# Patient Record
Sex: Female | Born: 1965 | Race: White | Hispanic: No | Marital: Married | Smoking: Never smoker
Health system: Southern US, Community
[De-identification: ages and names within clinical notes are randomized; demographics above are authoritative.]

## PROBLEM LIST (undated history)

## (undated) DIAGNOSIS — C439 Malignant melanoma of skin, unspecified: Secondary | ICD-10-CM

## (undated) DIAGNOSIS — E669 Obesity, unspecified: Secondary | ICD-10-CM

## (undated) DIAGNOSIS — E559 Vitamin D deficiency, unspecified: Secondary | ICD-10-CM

## (undated) DIAGNOSIS — N951 Menopausal and female climacteric states: Secondary | ICD-10-CM

## (undated) DIAGNOSIS — I10 Essential (primary) hypertension: Secondary | ICD-10-CM

## (undated) DIAGNOSIS — H6981 Other specified disorders of Eustachian tube, right ear: Secondary | ICD-10-CM

## (undated) HISTORY — DX: Vitamin D deficiency, unspecified: E55.9

## (undated) HISTORY — DX: Obesity, unspecified: E66.9

## (undated) HISTORY — DX: Menopausal and female climacteric states: N95.1

## (undated) HISTORY — DX: Other specified disorders of eustachian tube, right ear: H69.81

## (undated) HISTORY — PX: ADENOIDECTOMY: SUR15

## (undated) HISTORY — PX: MELANOMA EXCISION: SHX5266

## (undated) HISTORY — DX: Essential (primary) hypertension: I10

## (undated) HISTORY — PX: CLOSED REDUCTION FINGER WITH PERCUTANEOUS PINNING: SHX5612

---

## 1990-03-06 DIAGNOSIS — C439 Malignant melanoma of skin, unspecified: Secondary | ICD-10-CM

## 1990-03-06 HISTORY — DX: Malignant melanoma of skin, unspecified: C43.9

## 1990-03-06 HISTORY — PX: COLPOSCOPY: SHX161

## 1998-04-05 ENCOUNTER — Other Ambulatory Visit: Admission: RE | Admit: 1998-04-05 | Discharge: 1998-04-05 | Payer: Self-pay | Admitting: *Deleted

## 1999-06-22 ENCOUNTER — Other Ambulatory Visit: Admission: RE | Admit: 1999-06-22 | Discharge: 1999-06-22 | Payer: Self-pay | Admitting: *Deleted

## 2000-06-07 ENCOUNTER — Other Ambulatory Visit: Admission: RE | Admit: 2000-06-07 | Discharge: 2000-06-07 | Payer: Self-pay | Admitting: *Deleted

## 2001-08-28 ENCOUNTER — Encounter: Payer: Self-pay | Admitting: *Deleted

## 2001-08-28 ENCOUNTER — Ambulatory Visit (HOSPITAL_COMMUNITY): Admission: RE | Admit: 2001-08-28 | Discharge: 2001-08-28 | Payer: Self-pay | Admitting: *Deleted

## 2003-03-26 ENCOUNTER — Encounter (INDEPENDENT_AMBULATORY_CARE_PROVIDER_SITE_OTHER): Payer: Self-pay | Admitting: *Deleted

## 2003-03-26 ENCOUNTER — Ambulatory Visit (HOSPITAL_BASED_OUTPATIENT_CLINIC_OR_DEPARTMENT_OTHER): Admission: RE | Admit: 2003-03-26 | Discharge: 2003-03-26 | Payer: Self-pay | Admitting: Plastic Surgery

## 2003-03-26 ENCOUNTER — Ambulatory Visit (HOSPITAL_COMMUNITY): Admission: RE | Admit: 2003-03-26 | Discharge: 2003-03-26 | Payer: Self-pay | Admitting: Plastic Surgery

## 2006-07-02 ENCOUNTER — Ambulatory Visit (HOSPITAL_COMMUNITY): Admission: RE | Admit: 2006-07-02 | Discharge: 2006-07-02 | Payer: Self-pay | Admitting: Specialist

## 2007-08-19 ENCOUNTER — Ambulatory Visit (HOSPITAL_COMMUNITY): Admission: RE | Admit: 2007-08-19 | Discharge: 2007-08-19 | Payer: Self-pay | Admitting: Specialist

## 2008-08-20 ENCOUNTER — Ambulatory Visit (HOSPITAL_COMMUNITY): Admission: RE | Admit: 2008-08-20 | Discharge: 2008-08-20 | Payer: Self-pay | Admitting: Specialist

## 2009-09-21 ENCOUNTER — Ambulatory Visit (HOSPITAL_COMMUNITY): Admission: RE | Admit: 2009-09-21 | Discharge: 2009-09-21 | Payer: Self-pay | Admitting: Specialist

## 2010-07-22 NOTE — Op Note (Signed)
Martha Adkins, [place]                          ACCOUNT NO.:  [id number]   MEDICAL RECORD NO.:  [id number]                   PATIENT TYPE:  AMB   LOCATION:  DSC                                  FACILITY:  [facility]   PHYSICIAN:  Alfredia Ferguson, M.D.               DATE OF BIRTH:  07/02/1965   DATE OF PROCEDURE:  03/26/2003  DATE OF DISCHARGE:                                 OPERATIVE REPORT   PREOPERATIVE DIAGNOSIS:  A 1 cm recurrent dysplastic nevus, left upper back.   POSTOPERATIVE DIAGNOSIS:  A 1 cm recurrent dysplastic nevus, left upper  back.   OPERATION PERFORMED:  An elliptical excision of recurrent dysplastic nevus,  left upper back.   SURGEON:  Alfredia Ferguson, M.D.   ANESTHESIA:  2% Xylocaine 1:100,000 epinephrine.   INDICATIONS FOR PROCEDURE:  The patient is a 45 year old woman who had a  dysplastic removed from her left upper back.  She now has a mature scar but  there is pigmentation at the border of the scar.  She wishes to have the  area further excised.  She understands the risks of trading what she has for  a permanent potentially unsightly scar.  In spite of that she wished to  proceed with the surgery.   DESCRIPTION OF PROCEDURE:  The skin was marked in elliptical fashion with 2  to 3 mm margins around the lesion.  Local anesthesia using 2% Xylocaine  1:100,000 epinephrine was infiltrated.  The back was prepped with Betadine  and draped with sterile drapes.  Elliptical excision of the lesion down to  the level of the subcutaneous tissue was carried out.  Specimen was passed  off for pathology.  Hemostasis was achieved using electrocautery.  The wound  was closed by approximating the dermis using multiple interrupted 3-0  Monocryl sutures followed by running 3-0 Prolene subcuticular.  Dressing was  applied.  The patient was discharged to home in satisfactory condition.                                               Alfredia Ferguson, M.D.    WBB/MEDQ  D:   03/26/2003  T:  03/26/2003  Job:  [id number]   cc:   Theodora Blow. Danella Deis, M.D.  [address]  [place]  [place] [address]  Fax: [phone]

## 2010-10-11 ENCOUNTER — Other Ambulatory Visit (HOSPITAL_COMMUNITY): Payer: Self-pay | Admitting: *Deleted

## 2010-10-11 DIAGNOSIS — Z1231 Encounter for screening mammogram for malignant neoplasm of breast: Secondary | ICD-10-CM

## 2010-11-01 ENCOUNTER — Ambulatory Visit (HOSPITAL_COMMUNITY): Payer: BC Managed Care – PPO

## 2010-11-22 ENCOUNTER — Ambulatory Visit (HOSPITAL_COMMUNITY): Payer: BC Managed Care – PPO

## 2010-12-01 ENCOUNTER — Ambulatory Visit (HOSPITAL_COMMUNITY)
Admission: RE | Admit: 2010-12-01 | Discharge: 2010-12-01 | Disposition: A | Discharge status: Home / Self Care | Payer: BC Managed Care – PPO | Source: Ambulatory Visit | Attending: Specialist | Admitting: Specialist

## 2010-12-01 DIAGNOSIS — Z1231 Encounter for screening mammogram for malignant neoplasm of breast: Secondary | ICD-10-CM | POA: Insufficient documentation

## 2011-11-22 ENCOUNTER — Other Ambulatory Visit (HOSPITAL_COMMUNITY): Payer: Self-pay | Admitting: Specialist

## 2011-11-22 DIAGNOSIS — Z1231 Encounter for screening mammogram for malignant neoplasm of breast: Secondary | ICD-10-CM

## 2011-12-11 ENCOUNTER — Ambulatory Visit (HOSPITAL_COMMUNITY)
Admission: RE | Admit: 2011-12-11 | Discharge: 2011-12-11 | Disposition: A | Discharge status: Home / Self Care | Payer: BC Managed Care – PPO | Source: Ambulatory Visit | Attending: Specialist | Admitting: Specialist

## 2011-12-11 DIAGNOSIS — Z1231 Encounter for screening mammogram for malignant neoplasm of breast: Secondary | ICD-10-CM | POA: Insufficient documentation

## 2011-12-13 ENCOUNTER — Other Ambulatory Visit: Payer: Self-pay | Admitting: Specialist

## 2011-12-13 DIAGNOSIS — R928 Other abnormal and inconclusive findings on diagnostic imaging of breast: Secondary | ICD-10-CM

## 2011-12-20 ENCOUNTER — Ambulatory Visit
Admission: RE | Admit: 2011-12-20 | Discharge: 2011-12-20 | Disposition: A | Discharge status: Home / Self Care | Payer: BC Managed Care – PPO | Source: Ambulatory Visit | Attending: Specialist | Admitting: Specialist

## 2011-12-20 DIAGNOSIS — R928 Other abnormal and inconclusive findings on diagnostic imaging of breast: Secondary | ICD-10-CM

## 2012-12-03 ENCOUNTER — Other Ambulatory Visit (HOSPITAL_COMMUNITY): Payer: Self-pay | Admitting: Specialist

## 2012-12-03 DIAGNOSIS — Z1231 Encounter for screening mammogram for malignant neoplasm of breast: Secondary | ICD-10-CM

## 2012-12-23 ENCOUNTER — Ambulatory Visit (HOSPITAL_COMMUNITY)
Admission: RE | Admit: 2012-12-23 | Discharge: 2012-12-23 | Disposition: A | Discharge status: Home / Self Care | Payer: BC Managed Care – PPO | Source: Ambulatory Visit | Attending: Specialist | Admitting: Specialist

## 2012-12-23 DIAGNOSIS — Z1231 Encounter for screening mammogram for malignant neoplasm of breast: Secondary | ICD-10-CM

## 2013-12-22 ENCOUNTER — Other Ambulatory Visit (HOSPITAL_BASED_OUTPATIENT_CLINIC_OR_DEPARTMENT_OTHER): Payer: Self-pay | Admitting: Specialist

## 2013-12-22 DIAGNOSIS — Z1231 Encounter for screening mammogram for malignant neoplasm of breast: Secondary | ICD-10-CM

## 2014-01-09 ENCOUNTER — Ambulatory Visit (HOSPITAL_BASED_OUTPATIENT_CLINIC_OR_DEPARTMENT_OTHER)
Admission: RE | Admit: 2014-01-09 | Discharge: 2014-01-09 | Disposition: A | Discharge status: Home / Self Care | Payer: BC Managed Care – PPO | Source: Ambulatory Visit | Attending: Specialist | Admitting: Specialist

## 2014-01-09 DIAGNOSIS — Z1231 Encounter for screening mammogram for malignant neoplasm of breast: Secondary | ICD-10-CM | POA: Insufficient documentation

## 2015-10-26 ENCOUNTER — Encounter: Payer: Self-pay | Admitting: *Deleted

## 2015-10-26 ENCOUNTER — Emergency Department (INDEPENDENT_AMBULATORY_CARE_PROVIDER_SITE_OTHER): Payer: BC Managed Care – PPO

## 2015-10-26 ENCOUNTER — Emergency Department
Admission: EM | Admit: 2015-10-26 | Discharge: 2015-10-26 | Disposition: A | Discharge status: Home / Self Care | Payer: BC Managed Care – PPO | Source: Home / Self Care | Attending: Family Medicine | Admitting: Family Medicine

## 2015-10-26 DIAGNOSIS — M25572 Pain in left ankle and joints of left foot: Secondary | ICD-10-CM

## 2015-10-26 DIAGNOSIS — S92355A Nondisplaced fracture of fifth metatarsal bone, left foot, initial encounter for closed fracture: Secondary | ICD-10-CM

## 2015-10-26 DIAGNOSIS — X501XXA Overexertion from prolonged static or awkward postures, initial encounter: Secondary | ICD-10-CM

## 2015-10-26 DIAGNOSIS — M7732 Calcaneal spur, left foot: Secondary | ICD-10-CM

## 2015-10-26 HISTORY — DX: Malignant melanoma of skin, unspecified: C43.9

## 2015-10-26 NOTE — ED Triage Notes (Signed)
Pt reports rolling her left foot/ankle yesterday. She reports same injury with 5th metatarsal fx 20 years ago. Used Ice and IBF @ home.

## 2015-10-26 NOTE — ED Provider Notes (Signed)
CSN: [id number]     Arrival date & time 10/26/15  1533 History   First MD Initiated Contact with Patient 10/26/15 [phone]     Chief Complaint  Patient presents with  . Foot Pain   (Consider location/radiation/quality/duration/timing/severity/associated sxs/prior Treatment) HPI Martha Adkins is a 50 y.o. female presenting to UC with c/o Left lateral foot and ankle pain that started yesterday after she accidentally rolled her foot.  Pain is aching and sore, 6/10.  She has been taking ibuprofen and icing with moderate relief, however pain is more severe with palpation and ambulation.  Reports hx of fracture of her Left 5th metatarsal about 20 years ago. She had a post-op shoe at that time but did not need surgery. No other injuries.    Past Medical History:  Diagnosis Date  . Melanoma [facility])    Past Surgical History:  Procedure Laterality Date  . CLOSED REDUCTION FINGER WITH PERCUTANEOUS PINNING     History reviewed. No pertinent family history. Social History  Substance Use Topics  . Smoking status: Never Smoker  . Smokeless tobacco: Never Used  . Alcohol use Yes   OB History    No data available     Review of Systems  Musculoskeletal: Positive for arthralgias, joint swelling and myalgias.       Left ankle and foot  Skin: Negative for color change and wound.  Neurological: Negative for weakness and numbness.    Allergies  Cortizone-10 [hydrocortisone] and Supartz [sodium hyaluronate]  Home Medications   Prior to Admission medications   Not on File   Meds Ordered and Administered this Visit  Medications - No data to display  BP 147/88 (BP Location: Left Arm)   Pulse 93   Ht 5\' 4"  (1.626 m)   Wt (!) 316 lb (143.3 kg)   LMP 09/30/2015   SpO2 94%   BMI 54.24 kg/m  No data found.   Physical Exam  Constitutional: She is oriented to person, place, and time. She appears well-developed and well-nourished.  HENT:  Head: Normocephalic and atraumatic.  Eyes: EOM are  normal.  Neck: Normal range of motion.  Cardiovascular: Normal rate.   Pulmonary/Chest: Effort normal.  Musculoskeletal: Normal range of motion. She exhibits edema and tenderness.  Left ankle: mild to moderate edema to lateral aspect with mild tenderness. Full ROM. Left foot: mild edema to lateral aspect, tenderness from proximal to mid 5th metatarsal.   Neurological: She is alert and oriented to person, place, and time.  Skin: Skin is warm and dry. Capillary refill takes less than 2 seconds.  Left ankle and foot: skin in tact, no ecchymosis or erythema.   Psychiatric: She has a normal mood and affect. Her behavior is normal.  Nursing note and vitals reviewed.   Urgent Care Course   Clinical Course    Procedures (including critical care time)  Labs Review Labs Reviewed - No data to display  Imaging Review Dg Ankle Complete Left  Result Date: 10/26/2015 CLINICAL DATA:  Twisting injury to the left foot and ankle yesterday while walking. Lateral left foot and ankle pain and swelling. Initial encounter. EXAM: LEFT ANKLE COMPLETE - 3+ VIEW COMPARISON:  No prior left ankle imaging. Left foot x-rays obtained concurrently. FINDINGS: Mild lateral soft tissue swelling. No evidence of acute fracture. Ankle mortise intact with well-preserved joint space. Well-preserved bone mineral density. No intrinsic osseous abnormalities. No visible joint effusion. IMPRESSION: No osseous abnormality involving ankle. Electronically Signed   By: Sherran Needs.D.  On: 10/26/2015 16:53   Dg Foot Complete Left  Result Date: 10/26/2015 CLINICAL DATA:  Twisting injury to the left foot and ankle yesterday while walking. Lateral left foot and ankle pain and swelling. Initial encounter. EXAM: LEFT FOOT - COMPLETE 3+ VIEW COMPARISON:  No prior foot imaging. Left ankle x-rays obtained concurrently. FINDINGS: Nondisplaced fracture involving the proximal shaft of the 5th metatarsal. No fractures elsewhere. Mild  degenerative changes involving the midfoot. Moderate-sized plantar calcaneal spur. IMPRESSION: Acute traumatic nondisplaced fracture involving the proximal shaft of the 5th metatarsal. Electronically Signed   By: Evangeline Dakin M.D.   On: 10/26/2015 16:54     MDM   1. Closed nondisplaced fracture of fifth metatarsal bone of left foot, initial encounter   2. Calcaneal spur of foot, left    Pt c/o Left ankle and foot pain. Tenderness to lateral aspect of ankle and foot on exam.   Plain films: significant for acute traumatic nondisplaced fracture involving the proximal shaft of the 5th metatarsal   Will place pt in [employer].   Pt declined prescription pain medication Home care instructions provided. Encouraged f/u with Sports Medicine or Orthopedist in 2-3 weeks for recheck of symptoms and ongoing management of foot fracture. Patient verbalized understanding and agreement with treatment plan.     Noland Fordyce, PA-C 10/26/15 [phone]

## 2016-03-20 LAB — HM MAMMOGRAPHY

## 2016-03-21 LAB — HM PAP SMEAR: HM Pap smear: NEGATIVE

## 2016-08-30 ENCOUNTER — Ambulatory Visit (INDEPENDENT_AMBULATORY_CARE_PROVIDER_SITE_OTHER): Payer: BC Managed Care – PPO | Admitting: Physician Assistant

## 2016-08-30 ENCOUNTER — Encounter: Payer: Self-pay | Admitting: Physician Assistant

## 2016-08-30 VITALS — BP 150/100 | HR 73 | Temp 98.4°F | Resp 18 | Wt 323.1 lb

## 2016-08-30 DIAGNOSIS — I10 Essential (primary) hypertension: Secondary | ICD-10-CM | POA: Insufficient documentation

## 2016-08-30 DIAGNOSIS — H66001 Acute suppurative otitis media without spontaneous rupture of ear drum, right ear: Secondary | ICD-10-CM

## 2016-08-30 DIAGNOSIS — R030 Elevated blood-pressure reading, without diagnosis of hypertension: Secondary | ICD-10-CM | POA: Diagnosis not present

## 2016-08-30 DIAGNOSIS — N951 Menopausal and female climacteric states: Secondary | ICD-10-CM

## 2016-08-30 DIAGNOSIS — Z7689 Persons encountering health services in other specified circumstances: Secondary | ICD-10-CM | POA: Diagnosis not present

## 2016-08-30 DIAGNOSIS — J069 Acute upper respiratory infection, unspecified: Secondary | ICD-10-CM

## 2016-08-30 HISTORY — DX: Menopausal and female climacteric states: N95.1

## 2016-08-30 MED ORDER — IPRATROPIUM BROMIDE 0.06 % NA SOLN: 1.0000 | Freq: Four times a day (QID) | NASAL | 0 refills | Status: DC | PRN

## 2016-08-30 MED ORDER — AMOXICILLIN-POT CLAVULANATE 875-125 MG PO TABS: 1.0000 | ORAL_TABLET | Freq: Two times a day (BID) | ORAL | 0 refills | Status: AC

## 2016-08-30 MED ORDER — BENZONATATE 200 MG PO CAPS: 200.0000 mg | ORAL_CAPSULE | Freq: Three times a day (TID) | ORAL | 0 refills | Status: DC | PRN

## 2016-08-30 NOTE — Progress Notes (Signed)
HPI:                                                                Martha Adkins is a 51 y.o. female who presents to [facility]: Primary Care [facility] today to establish care  Current concerns include cough / URI symptoms  Cough  This is a new problem. The current episode started in the past 7 days (x 4 days). The problem has been unchanged. The problem occurs hourly. The cough is productive of sputum. Associated symptoms include ear congestion (right), myalgias, nasal congestion, rhinorrhea, a sore throat, shortness of breath and wheezing. Pertinent negatives include no chest pain, chills, fever, headaches or rash. The symptoms are aggravated by lying down. Treatments tried: Ibuprofen. There is no history of asthma, COPD or environmental allergies.    Past Medical History:  Diagnosis Date  . Melanoma [facility])    Past Surgical History:  Procedure Laterality Date  . CLOSED REDUCTION FINGER WITH PERCUTANEOUS PINNING     Social History  Substance Use Topics  . Smoking status: Never Smoker  . Smokeless tobacco: Never Used  . Alcohol use Yes   family history is not on file.  ROS: Review of Systems  Constitutional: Negative for chills and fever.  HENT: Positive for rhinorrhea and sore throat.   Respiratory: Positive for cough, shortness of breath and wheezing.   Cardiovascular: Negative for chest pain, palpitations, claudication and leg swelling.  Genitourinary: Negative for hematuria.  Musculoskeletal: Positive for myalgias.  Skin: Negative for rash.  Neurological: Negative for headaches.  Endo/Heme/Allergies: Negative for environmental allergies.     Medications: No current outpatient prescriptions on file.   No current facility-administered medications for this visit.    Allergies  Allergen Reactions  . Cortizone-10 [Hydrocortisone]   . Supartz [Sodium Hyaluronate]        Objective:  BP (!) 177/108   Pulse 73   Temp 98.4 F (36.9 C)    Resp 18   Wt (!) 323 lb 1.6 oz (146.6 kg)   BMI 55.46 kg/m  Gen: well-groomed, cooperative, not ill-appearing, no distress HEENT: normal conjunctiva, left external canal normal and left TM clear, right external canal normal, right TM erythematous with suppurative effusion, no mastoid tenderness, nasal mucosa edemetous, oropharynx clear, moist mucus membranes, no frontal or maxillary sinus tenderness, neck supple, trachea midline Pulm: Normal work of breathing, normal phonation, clear to auscultation bilaterally, no wheezes, rales or rhonchi CV: Normal rate, regular rhythm, s1 and s2 distinct, no murmurs, clicks or rubs  GI: abdomen soft, nondistended, nontender Neuro: alert and oriented x 3, EOM's intact, no tremor MSK: moving all extremities, normal gait and station, no peripheral edema Lymph: no cervical or tonsillar adenopathy Skin: warm, dry, intact; no rashes or lesions on exposed skin, no cyanosis   No results found for this or any previous visit (from the past 72 hour(s)). No results found.    Assessment and Plan: 51 y.o. female with   1. Encounter to establish care - reviewed PMH   2. Elevated blood pressure reading - patient asymptomatic with elevated BP today. Denies history of hypertension - Check blood pressure at home for the next 2 weeks - Check around the same time each day in a relaxed setting  -  Limit salt. Follow DASH eating plan - Follow-up in 2 weeks  3. Perimenopausal   4. Acute suppurative otitis media of right ear without spontaneous rupture of tympanic membrane, recurrence not specified - tympanogram shows low peak height and large ear volume - amoxicillin-clavulanate (AUGMENTIN) 875-125 MG tablet; Take 1 tablet by mouth 2 (two) times daily.  Dispense: 20 tablet; Refill: 0 - ipratropium (ATROVENT) 0.06 % nasal spray; Place 1 spray into both nostrils 4 (four) times daily as needed.  Dispense: 15 mL; Refill: 0  5. Acute URI - ipratropium (ATROVENT)  0.06 % nasal spray; Place 1 spray into both nostrils 4 (four) times daily as needed.  Dispense: 15 mL; Refill: 0 - benzonatate (TESSALON) 200 MG capsule; Take 1 capsule (200 mg total) by mouth 3 (three) times daily as needed for cough.  Dispense: 45 capsule; Refill: 0     Patient education and anticipatory guidance given Patient agrees with treatment plan Follow-up in 2 weeks for blood pressure or sooner as needed if symptoms worsen or fail to improve  Darlyne Russian PA-C

## 2016-08-30 NOTE — Patient Instructions (Addendum)
For your ear/upper respiratory infection - Take antibiotic as prescribed - Use nasal spray up to 4 times daily - Continue Ibuprofen 600mg  every 8 hours as needed - May also add Tylenol 1000mg  every 8 hours for pain - Mucinex to help make cough more productive. Take with at least 8 ounces of water - Tessalon pearls (benzonatate) every 8 hours as needed for cough - Drink plenty of clear liquids  For your blood pressure: - Check blood pressure at home for the next 2 weeks - Check around the same time each day in a relaxed setting  - Limit salt. Follow DASH eating plan - Follow-up in 2 weeks    DASH Eating Plan DASH stands for "Dietary Approaches to Stop Hypertension." The DASH eating plan is a healthy eating plan that has been shown to reduce high blood pressure (hypertension). It may also reduce your risk for type 2 diabetes, heart disease, and stroke. The DASH eating plan may also help with weight loss. What are tips for following this plan? General guidelines  Avoid eating more than 2,300 mg (milligrams) of salt (sodium) a day. If you have hypertension, you may need to reduce your sodium intake to 1,500 mg a day.  Limit alcohol intake to no more than 1 drink a day for nonpregnant women and 2 drinks a day for men. One drink equals 12 oz of beer, 5 oz of wine, or 1 oz of hard liquor.  Work with your health care provider to maintain a healthy body weight or to lose weight. Ask what an ideal weight is for you.  Get at least 30 minutes of exercise that causes your heart to beat faster (aerobic exercise) most days of the week. Activities may include walking, swimming, or biking.  Work with your health care provider or diet and nutrition specialist (dietitian) to adjust your eating plan to your individual calorie needs. Reading food labels  Check food labels for the amount of sodium per serving. Choose foods with less than 5 percent of the Daily Value of sodium. Generally, foods with less  than 300 mg of sodium per serving fit into this eating plan.  To find whole grains, look for the word "whole" as the first word in the ingredient list. Shopping  Buy products labeled as "low-sodium" or "no salt added."  Buy fresh foods. Avoid canned foods and premade or frozen meals. Cooking  Avoid adding salt when cooking. Use salt-free seasonings or herbs instead of table salt or sea salt. Check with your health care provider or pharmacist before using salt substitutes.  Do not fry foods. Cook foods using healthy methods such as baking, boiling, grilling, and broiling instead.  Cook with heart-healthy oils, such as olive, canola, soybean, or sunflower oil. Meal planning   Eat a balanced diet that includes: ? 5 or more servings of fruits and vegetables each day. At each meal, try to fill half of your plate with fruits and vegetables. ? Up to 6-8 servings of whole grains each day. ? Less than 6 oz of lean meat, poultry, or fish each day. A 3-oz serving of meat is about the same size as a deck of cards. One egg equals 1 oz. ? 2 servings of low-fat dairy each day. ? A serving of nuts, seeds, or beans 5 times each week. ? Heart-healthy fats. Healthy fats called Omega-3 fatty acids are found in foods such as flaxseeds and coldwater fish, like sardines, salmon, and mackerel.  Limit how much you eat  of the following: ? Canned or prepackaged foods. ? Food that is high in trans fat, such as fried foods. ? Food that is high in saturated fat, such as fatty meat. ? Sweets, desserts, sugary drinks, and other foods with added sugar. ? Full-fat dairy products.  Do not salt foods before eating.  Try to eat at least 2 vegetarian meals each week.  Eat more home-cooked food and less restaurant, buffet, and fast food.  When eating at a restaurant, ask that your food be prepared with less salt or no salt, if possible. What foods are recommended? The items listed may not be a complete list. Talk  with your dietitian about what dietary choices are best for you. Grains Whole-grain or whole-wheat bread. Whole-grain or whole-wheat pasta. Brown rice. Modena Morrow. Bulgur. Whole-grain and low-sodium cereals. Pita bread. Low-fat, low-sodium crackers. Whole-wheat flour tortillas. Vegetables Fresh or frozen vegetables (raw, steamed, roasted, or grilled). Low-sodium or reduced-sodium tomato and vegetable juice. Low-sodium or reduced-sodium tomato sauce and tomato paste. Low-sodium or reduced-sodium canned vegetables. Fruits All fresh, dried, or frozen fruit. Canned fruit in natural juice (without added sugar). Meat and other protein foods Skinless chicken or [place]. Ground chicken or [place]. Pork with fat trimmed off. Fish and seafood. Egg whites. Dried beans, peas, or lentils. Unsalted nuts, nut butters, and seeds. Unsalted canned beans. Lean cuts of beef with fat trimmed off. Low-sodium, lean deli meat. Dairy Low-fat (1%) or fat-free (skim) milk. Fat-free, low-fat, or reduced-fat cheeses. Nonfat, low-sodium ricotta or cottage cheese. Low-fat or nonfat yogurt. Low-fat, low-sodium cheese. Fats and oils Soft margarine without trans fats. Vegetable oil. Low-fat, reduced-fat, or light mayonnaise and salad dressings (reduced-sodium). Canola, safflower, olive, soybean, and sunflower oils. Avocado. Seasoning and other foods Herbs. Spices. Seasoning mixes without salt. Unsalted popcorn and pretzels. Fat-free sweets. What foods are not recommended? The items listed may not be a complete list. Talk with your dietitian about what dietary choices are best for you. Grains Baked goods made with fat, such as croissants, muffins, or some breads. Dry pasta or rice meal packs. Vegetables Creamed or fried vegetables. Vegetables in a cheese sauce. Regular canned vegetables (not low-sodium or reduced-sodium). Regular canned tomato sauce and paste (not low-sodium or reduced-sodium). Regular tomato and vegetable  juice (not low-sodium or reduced-sodium). Angie Fava. Olives. Fruits Canned fruit in a light or heavy syrup. Fried fruit. Fruit in cream or butter sauce. Meat and other protein foods Fatty cuts of meat. Ribs. Fried meat. Berniece Salines. Sausage. Bologna and other processed lunch meats. Salami. Fatback. Hotdogs. Bratwurst. Salted nuts and seeds. Canned beans with added salt. Canned or smoked fish. Whole eggs or egg yolks. Chicken or [place] with skin. Dairy Whole or 2% milk, cream, and half-and-half. Whole or full-fat cream cheese. Whole-fat or sweetened yogurt. Full-fat cheese. Nondairy creamers. Whipped toppings. Processed cheese and cheese spreads. Fats and oils Butter. Stick margarine. Lard. Shortening. Ghee. Bacon fat. Tropical oils, such as coconut, palm kernel, or palm oil. Seasoning and other foods Salted popcorn and pretzels. Onion salt, garlic salt, seasoned salt, table salt, and sea salt. Worcestershire sauce. Tartar sauce. Barbecue sauce. Teriyaki sauce. Soy sauce, including reduced-sodium. Steak sauce. Canned and packaged gravies. Fish sauce. Oyster sauce. Cocktail sauce. Horseradish that you find on the shelf. Ketchup. Mustard. Meat flavorings and tenderizers. Bouillon cubes. Hot sauce and Tabasco sauce. Premade or packaged marinades. Premade or packaged taco seasonings. Relishes. Regular salad dressings. Where to find more information:  National Heart, Lung, and Blood Institute: https://wilson-eaton.com/  American Heart  Association: www.heart.org Summary  The DASH eating plan is a healthy eating plan that has been shown to reduce high blood pressure (hypertension). It may also reduce your risk for type 2 diabetes, heart disease, and stroke.  With the DASH eating plan, you should limit salt (sodium) intake to 2,300 mg a day. If you have hypertension, you may need to reduce your sodium intake to 1,500 mg a day.  When on the DASH eating plan, aim to eat more fresh fruits and vegetables, whole grains,  lean proteins, low-fat dairy, and heart-healthy fats.  Work with your health care provider or diet and nutrition specialist (dietitian) to adjust your eating plan to your individual calorie needs. This information is not intended to replace advice given to you by your health care provider. Make sure you discuss any questions you have with your health care provider. Document Released: 02/09/2011 Document Revised: 02/14/2016 Document Reviewed: 02/14/2016 Elsevier Interactive Patient Education  2017 [employer].

## 2016-09-13 ENCOUNTER — Encounter: Payer: Self-pay | Admitting: Physician Assistant

## 2016-09-13 ENCOUNTER — Ambulatory Visit (INDEPENDENT_AMBULATORY_CARE_PROVIDER_SITE_OTHER): Payer: BC Managed Care – PPO | Admitting: Physician Assistant

## 2016-09-13 VITALS — BP 154/85 | HR 69 | Temp 97.7°F | Wt 323.0 lb

## 2016-09-13 DIAGNOSIS — H6991 Unspecified Eustachian tube disorder, right ear: Secondary | ICD-10-CM

## 2016-09-13 DIAGNOSIS — H6981 Other specified disorders of Eustachian tube, right ear: Secondary | ICD-10-CM | POA: Diagnosis not present

## 2016-09-13 DIAGNOSIS — I10 Essential (primary) hypertension: Secondary | ICD-10-CM | POA: Diagnosis not present

## 2016-09-13 DIAGNOSIS — Z9189 Other specified personal risk factors, not elsewhere classified: Secondary | ICD-10-CM | POA: Diagnosis not present

## 2016-09-13 DIAGNOSIS — H6591 Unspecified nonsuppurative otitis media, right ear: Secondary | ICD-10-CM | POA: Diagnosis not present

## 2016-09-13 HISTORY — DX: Essential (primary) hypertension: I10

## 2016-09-13 HISTORY — DX: Unspecified eustachian tube disorder, right ear: H69.91

## 2016-09-13 HISTORY — DX: Other specified disorders of eustachian tube, right ear: H69.81

## 2016-09-13 MED ORDER — CETIRIZINE HCL 10 MG PO TABS: 10.0000 mg | ORAL_TABLET | Freq: Every day | ORAL | 11 refills | Status: DC

## 2016-09-13 MED ORDER — HYDROCHLOROTHIAZIDE 25 MG PO TABS: 25.0000 mg | ORAL_TABLET | Freq: Every day | ORAL | 0 refills | Status: DC

## 2016-09-13 MED ORDER — PREDNISONE 50 MG PO TABS: ORAL_TABLET | ORAL | 0 refills | Status: DC

## 2016-09-13 MED ORDER — TRIAMCINOLONE ACETONIDE 55 MCG/ACT NA AERO: 2.0000 | INHALATION_SPRAY | Freq: Every day | NASAL | 12 refills | Status: DC

## 2016-09-13 NOTE — Progress Notes (Signed)
HPI:                                                                Martha Adkins is a 51 y.o. female who presents to [facility]: [place] today for blood pressure follow-up  Patient has been monitoring BP's at home after elevated reading in the office. BP range 144-166/74-89 Denies vision change, headache, chest pain with exertion, orthopnea, lightheadedness, syncope and edema. Patient does endorse snoring and excessive daytime sleepiness. Risk factors include: morbid obesity  Continues to endorse nasal congestion, PND, right ear congestion with mild hearing loss. She has completed a course of Augmentin and has been using Atrovent nasal spray. States nasal spray worsens her congestion. Reports history of recurrent ear infections in childhood requiring tympanostomy and adenoidectomy.  Past Medical History:  Diagnosis Date  . Eustachian tube dysfunction, right 09/13/2016  . Hypertension goal BP (blood pressure) < 130/80 09/13/2016  . Melanoma ([place]) 1992   back  . Obesity   . Perimenopausal 08/30/2016  . Vitamin D deficiency    Past Surgical History:  Procedure Laterality Date  . ADENOIDECTOMY    . CLOSED REDUCTION FINGER WITH PERCUTANEOUS PINNING    . COLPOSCOPY  1992   laser  . MELANOMA EXCISION     Back   Social History  Substance Use Topics  . Smoking status: Passive Smoke Exposure - Never Smoker  . Smokeless tobacco: Never Used  . Alcohol use Yes     Comment: 2 / month   family history includes COPD in her mother; Hyperlipidemia in her father; Prostate cancer in her father.  ROS: negative except as noted in the HPI  Medications: Current Outpatient Prescriptions  Medication Sig Dispense Refill  . benzonatate (TESSALON) 200 MG capsule Take 1 capsule (200 mg total) by mouth 3 (three) times daily as needed for cough. 45 capsule 0  . Ergocalciferol (VITAMIN D2 PO) Take 5,000 mg by mouth.    . Multiple Vitamins-Minerals (MULTIVITAMIN  ADULT PO) Take by mouth.    . Omega-3 Fatty Acids (FISH OIL) 1000 MG CAPS Take by mouth.    . cetirizine (ZYRTEC) 10 MG tablet Take 1 tablet (10 mg total) by mouth daily. 30 tablet 11  . hydrochlorothiazide (HYDRODIURIL) 25 MG tablet Take 1 tablet (25 mg total) by mouth daily. 30 tablet 0  . predniSONE (DELTASONE) 50 MG tablet One tab PO daily for 5 days. 5 tablet 0  . triamcinolone (NASACORT) 55 MCG/ACT AERO nasal inhaler Place 2 sprays into the nose daily. 1 Inhaler 12   No current facility-administered medications for this visit.    Allergies  Allergen Reactions  . Cortizone-10 [Hydrocortisone]   . Supartz [Sodium Hyaluronate]     Objective:  BP (!) 154/85   Pulse 69   Temp 97.7 F (36.5 C) (Oral)   Wt (!) 323 lb (146.5 kg)   SpO2 99%   BMI 55.44 kg/m  Gen: well-groomed, cooperative, not ill-appearing, no distress HEENT: normal conjunctiva, left TM clear, right TM with serous effusion, nasal mucosa edematous, oropharynx clear, moist mucus membranes, no frontal or maxillary sinus tenderness, neck supple, trachea midline Pulm: Normal work of breathing, normal phonation, clear to auscultation bilaterally, no wheezes, rales or rhonchi CV: Normal rate, regular  rhythm, s1 and s2 distinct, no murmurs, clicks or rubs  GI: abdomen soft, nondistended, nontender Neuro: alert and oriented x 3, EOM's intact, no tremor MSK: moving all extremities, normal gait and station, no peripheral edema Lymph: no cervical or tonsillar adenopathy Skin: warm, dry, intact; no rashes or lesions on exposed skin, no cyanosis   No results found for this or any previous visit (from the past 72 hour(s)). No results found.    Assessment and Plan: 51 y.o. female with   1. Hypertension goal BP (blood pressure) < 130/80 - hydrochlorothiazide (HYDRODIURIL) 25 MG tablet; Take 1 tablet (25 mg total) by mouth daily.  Dispense: 30 tablet; Refill: 0 - therapeutic lifestyle changes - follow-up in 2 weeks for  nurse visit BP check  2. Eustachian tube dysfunction, right - triamcinolone (NASACORT) 55 MCG/ACT AERO nasal inhaler; Place 2 sprays into the nose daily.  Dispense: 1 Inhaler; Refill: 12 - cetirizine (ZYRTEC) 10 MG tablet; Take 1 tablet (10 mg total) by mouth daily.  Dispense: 30 tablet; Refill: 11  3. Middle ear effusion, right - predniSONE (DELTASONE) 50 MG tablet; One tab PO daily for 5 days.  Dispense: 5 tablet; Refill: 0 - If symptoms persist, referral to ENT  4. At risk for obstructive sleep apnea + STOPBANG - patient declines sleep study at this time  Patient education and anticipatory guidance given Patient agrees with treatment plan Follow-up in 2 weeks or sooner as needed if symptoms worsen or fail to improve  Darlyne Russian PA-C

## 2016-09-13 NOTE — Patient Instructions (Addendum)
For your blood pressure: - Start hydrochlorothiazide every morning - Check blood pressure at home and log - Check around the same time each day in a relaxed setting  - Limit salt. Follow DASH eating plan - Follow-up in 2 weeks for a nurse visit BP check  For ear: - Nasacort 1 spray each nostril daily - Cetirizine (Zyrtec) 1 tab at bedtime daily - Prednisone x 5 days - If symptoms persist, referral to ENT   Hypertension Hypertension, commonly called high blood pressure, is when the force of blood pumping through the arteries is too strong. The arteries are the blood vessels that carry blood from the heart throughout the body. Hypertension forces the heart to work harder to pump blood and may cause arteries to become narrow or stiff. Having untreated or uncontrolled hypertension can cause heart attacks, strokes, kidney disease, and other problems. A blood pressure reading consists of a higher number over a lower number. Ideally, your blood pressure should be below 120/80. The first ("top") number is called the systolic pressure. It is a measure of the pressure in your arteries as your heart beats. The second ("bottom") number is called the diastolic pressure. It is a measure of the pressure in your arteries as the heart relaxes. What are the causes? The cause of this condition is not known. What increases the risk? Some risk factors for high blood pressure are under your control. Others are not. Factors you can change  Smoking.  Having type 2 diabetes mellitus, high cholesterol, or both.  Not getting enough exercise or physical activity.  Being overweight.  Having too much fat, sugar, calories, or salt (sodium) in your diet.  Drinking too much alcohol. Factors that are difficult or impossible to change  Having chronic kidney disease.  Having a family history of high blood pressure.  Age. Risk increases with age.  Race. You may be at higher risk if you are  African-American.  Gender. Men are at higher risk than women before age 74. After age 13, women are at higher risk than men.  Having obstructive sleep apnea.  Stress. What are the signs or symptoms? Extremely high blood pressure (hypertensive crisis) may cause:  Headache.  Anxiety.  Shortness of breath.  Nosebleed.  Nausea and vomiting.  Severe chest pain.  Jerky movements you cannot control (seizures).  How is this diagnosed? This condition is diagnosed by measuring your blood pressure while you are seated, with your arm resting on a surface. The cuff of the blood pressure monitor will be placed directly against the skin of your upper arm at the level of your heart. It should be measured at least twice using the same arm. Certain conditions can cause a difference in blood pressure between your right and left arms. Certain factors can cause blood pressure readings to be lower or higher than normal (elevated) for a short period of time:  When your blood pressure is higher when you are in a health care provider's office than when you are at home, this is called white coat hypertension. Most people with this condition do not need medicines.  When your blood pressure is higher at home than when you are in a health care provider's office, this is called masked hypertension. Most people with this condition may need medicines to control blood pressure.  If you have a high blood pressure reading during one visit or you have normal blood pressure with other risk factors:  You may be asked to return on a  different day to have your blood pressure checked again.  You may be asked to monitor your blood pressure at home for 1 week or longer.  If you are diagnosed with hypertension, you may have other blood or imaging tests to help your health care provider understand your overall risk for other conditions. How is this treated? This condition is treated by making healthy lifestyle changes,  such as eating healthy foods, exercising more, and reducing your alcohol intake. Your health care provider may prescribe medicine if lifestyle changes are not enough to get your blood pressure under control, and if:  Your systolic blood pressure is above 130.  Your diastolic blood pressure is above 80.  Your personal target blood pressure may vary depending on your medical conditions, your age, and other factors. Follow these instructions at home: Eating and drinking  Eat a diet that is high in fiber and potassium, and low in sodium, added sugar, and fat. An example eating plan is called the DASH (Dietary Approaches to Stop Hypertension) diet. To eat this way: ? Eat plenty of fresh fruits and vegetables. Try to fill half of your plate at each meal with fruits and vegetables. ? Eat whole grains, such as whole wheat pasta, brown rice, or whole grain bread. Fill about one quarter of your plate with whole grains. ? Eat or drink low-fat dairy products, such as skim milk or low-fat yogurt. ? Avoid fatty cuts of meat, processed or cured meats, and poultry with skin. Fill about one quarter of your plate with lean proteins, such as fish, chicken without skin, beans, eggs, and tofu. ? Avoid premade and processed foods. These tend to be higher in sodium, added sugar, and fat.  Reduce your daily sodium intake. Most people with hypertension should eat less than 1,500 mg of sodium a day.  Limit alcohol intake to no more than 1 drink a day for nonpregnant women and 2 drinks a day for men. One drink equals 12 oz of beer, 5 oz of wine, or 1 oz of hard liquor. Lifestyle  Work with your health care provider to maintain a healthy body weight or to lose weight. Ask what an ideal weight is for you.  Get at least 30 minutes of exercise that causes your heart to beat faster (aerobic exercise) most days of the week. Activities may include walking, swimming, or biking.  Include exercise to strengthen your muscles  (resistance exercise), such as pilates or lifting weights, as part of your weekly exercise routine. Try to do these types of exercises for 30 minutes at least 3 days a week.  Do not use any products that contain nicotine or tobacco, such as cigarettes and e-cigarettes. If you need help quitting, ask your health care provider.  Monitor your blood pressure at home as told by your health care provider.  Keep all follow-up visits as told by your health care provider. This is important. Medicines  Take over-the-counter and prescription medicines only as told by your health care provider. Follow directions carefully. Blood pressure medicines must be taken as prescribed.  Do not skip doses of blood pressure medicine. Doing this puts you at risk for problems and can make the medicine less effective.  Ask your health care provider about side effects or reactions to medicines that you should watch for. Contact a health care provider if:  You think you are having a reaction to a medicine you are taking.  You have headaches that keep coming back (recurring).  You  feel dizzy.  You have swelling in your ankles.  You have trouble with your vision. Get help right away if:  You develop a severe headache or confusion.  You have unusual weakness or numbness.  You feel faint.  You have severe pain in your chest or abdomen.  You vomit repeatedly.  You have trouble breathing. Summary  Hypertension is when the force of blood pumping through your arteries is too strong. If this condition is not controlled, it may put you at risk for serious complications.  Your personal target blood pressure may vary depending on your medical conditions, your age, and other factors. For most people, a normal blood pressure is less than 120/80.  Hypertension is treated with lifestyle changes, medicines, or a combination of both. Lifestyle changes include weight loss, eating a healthy, low-sodium diet, exercising  more, and limiting alcohol. This information is not intended to replace advice given to you by your health care provider. Make sure you discuss any questions you have with your health care provider. Document Released: 02/20/2005 Document Revised: 01/19/2016 Document Reviewed: 01/19/2016 Elsevier Interactive Patient Education  [employer].

## 2016-09-20 ENCOUNTER — Encounter: Payer: Self-pay | Admitting: Physician Assistant

## 2016-09-21 ENCOUNTER — Encounter: Payer: Self-pay | Admitting: Physician Assistant

## 2016-09-29 ENCOUNTER — Telehealth: Payer: Self-pay

## 2016-09-29 ENCOUNTER — Ambulatory Visit (INDEPENDENT_AMBULATORY_CARE_PROVIDER_SITE_OTHER): Payer: BC Managed Care – PPO | Admitting: Physician Assistant

## 2016-09-29 VITALS — BP 154/90 | HR 87 | Resp 16 | Wt 310.9 lb

## 2016-09-29 DIAGNOSIS — I152 Hypertension secondary to endocrine disorders: Secondary | ICD-10-CM | POA: Insufficient documentation

## 2016-09-29 DIAGNOSIS — I10 Essential (primary) hypertension: Secondary | ICD-10-CM

## 2016-09-29 DIAGNOSIS — E1159 Type 2 diabetes mellitus with other circulatory complications: Secondary | ICD-10-CM | POA: Insufficient documentation

## 2016-09-29 MED ORDER — CANDESARTAN CILEXETIL-HCTZ 32-25 MG PO TABS: 1.0000 | ORAL_TABLET | Freq: Every day | ORAL | 1 refills | Status: DC

## 2016-09-29 NOTE — Telephone Encounter (Signed)
Pt.notified

## 2016-09-29 NOTE — Telephone Encounter (Signed)
-----   Message from [facility], [place] sent at 09/29/2016  9:27 AM EDT ----- Discontinue hydrodiuril Start Candesartan-HCTZ daily Continue to log BP's at home Return in 2 weeks for nurse BP check

## 2016-09-29 NOTE — Progress Notes (Signed)
Discontinue hydrodiuril Start Candesartan-HCTZ daily Continue to log BP's at home Return in 2 weeks for nurse BP check

## 2016-10-24 ENCOUNTER — Telehealth: Payer: Self-pay

## 2016-10-24 NOTE — Telephone Encounter (Signed)
Notified patient and transferred to scheduling for nurse appointment.

## 2016-10-24 NOTE — Telephone Encounter (Signed)
Reduce dose to 1/2 tablet daily. Continue to monitor BP's at home to a goal of <130/80 Come in for a nurse visit BP check in 1 week

## 2016-10-24 NOTE — Telephone Encounter (Signed)
Pt called and said that with the Candesartan cilexetil-HCTZ, she feels that her BP is dropping too low.  She had an episode last week of dizziness, clamminess, took it, and it was in the 90s/70s.  She stopped taking the medication Saturday, and it has come up to the 130s/80s.  Please advise.

## 2017-03-08 ENCOUNTER — Ambulatory Visit: Payer: BC Managed Care – PPO | Admitting: Physician Assistant

## 2017-03-08 ENCOUNTER — Encounter: Payer: Self-pay | Admitting: Physician Assistant

## 2017-03-08 VITALS — BP 161/102 | HR 76 | Temp 98.1°F | Resp 16 | Ht 64.0 in | Wt 310.6 lb

## 2017-03-08 DIAGNOSIS — E1169 Type 2 diabetes mellitus with other specified complication: Secondary | ICD-10-CM

## 2017-03-08 DIAGNOSIS — Z136 Encounter for screening for cardiovascular disorders: Secondary | ICD-10-CM

## 2017-03-08 DIAGNOSIS — I10 Essential (primary) hypertension: Secondary | ICD-10-CM

## 2017-03-08 DIAGNOSIS — Z79899 Other long term (current) drug therapy: Secondary | ICD-10-CM

## 2017-03-08 DIAGNOSIS — R05 Cough: Secondary | ICD-10-CM

## 2017-03-08 DIAGNOSIS — Z6841 Body Mass Index (BMI) 40.0 and over, adult: Secondary | ICD-10-CM

## 2017-03-08 DIAGNOSIS — E785 Hyperlipidemia, unspecified: Secondary | ICD-10-CM

## 2017-03-08 DIAGNOSIS — Z131 Encounter for screening for diabetes mellitus: Secondary | ICD-10-CM

## 2017-03-08 DIAGNOSIS — Z130 Encounter for screening for diseases of the blood and blood-forming organs and certain disorders involving the immune mechanism: Secondary | ICD-10-CM

## 2017-03-08 DIAGNOSIS — R058 Other specified cough: Secondary | ICD-10-CM

## 2017-03-08 DIAGNOSIS — Z13220 Encounter for screening for lipoid disorders: Secondary | ICD-10-CM

## 2017-03-08 DIAGNOSIS — Z85820 Personal history of malignant melanoma of skin: Secondary | ICD-10-CM | POA: Insufficient documentation

## 2017-03-08 DIAGNOSIS — E119 Type 2 diabetes mellitus without complications: Secondary | ICD-10-CM

## 2017-03-08 DIAGNOSIS — Z9889 Other specified postprocedural states: Secondary | ICD-10-CM

## 2017-03-08 LAB — HM DIABETES EYE EXAM

## 2017-03-08 MED ORDER — ASPIRIN EC 81 MG PO TBEC: 81.0000 mg | DELAYED_RELEASE_TABLET | Freq: Every day | ORAL | 3 refills | Status: DC

## 2017-03-08 MED ORDER — CANDESARTAN CILEXETIL-HCTZ 32-25 MG PO TABS: 1.0000 | ORAL_TABLET | Freq: Every day | ORAL | 1 refills | Status: DC

## 2017-03-08 NOTE — Patient Instructions (Addendum)
For your blood pressure: - send BP readings to me over MyChart  - Goal <130/80  - Low blood pressure is <90/60. Anything in between is okay. - Continue your BP meds - Start baby aspirin 81 mg to help prevent heart attack/stroke - Check blood pressure at home, log your readings  - Check around the same time each day in a relaxed setting - Limit salt to <1500 mg/day - Follow DASH eating plan - limit alcohol to 2 standard drinks per day - avoid tobacco products - weight loss: 7% of current body weight - Follow-up every 6 months   I have also ordered fasting labs. The lab is a walk-in open M-F 7:30a-4:30p (closed 12:30-1:30p). Nothing to eat or drink after midnight or at least 8 hours before your blood draw. You can have water and your medications.

## 2017-03-08 NOTE — Progress Notes (Signed)
HPI:                                                                Martha Adkins is a 52 y.o. female who presents to [place]: [place] today for hypertension follow-up  HTN: taking Candesartan-HCTZ daily. Compliant with medications. Checks BP's at home. BP range 130's/70's. Denies vision change, headache, chest pain with exertion, orthopnea, lightheadedness, syncope and edema. Risk factors include: obesity  Cough: reports cough, post-nasal drip x 3 weeks, gradually improving. States she had cold symptoms about 3 weeks ago. Now has residual cough productive of sputum. States cough is worse first thing in the morning, seems to improve during the day. Denies fever, chills, malaise, chest pain, hemoptysis. Took Mucinex for a few days. Has not needed to take any OTC medications for over a week.  Past Medical History:  Diagnosis Date  . Eustachian tube dysfunction, right 09/13/2016  . Hypertension goal BP (blood pressure) < 130/80 09/13/2016  . Melanoma ([place]) 1992   back  . Obesity   . Perimenopausal 08/30/2016  . Vitamin D deficiency    Past Surgical History:  Procedure Laterality Date  . ADENOIDECTOMY    . CLOSED REDUCTION FINGER WITH PERCUTANEOUS PINNING    . COLPOSCOPY  1992   laser  . MELANOMA EXCISION     Back   Social History   Tobacco Use  . Smoking status: Passive Smoke Exposure - Never Smoker  . Smokeless tobacco: Never Used  Substance Use Topics  . Alcohol use: Yes    Comment: 2 / month   family history includes COPD in her mother; Hyperlipidemia in her father; Prostate cancer in her father.  ROS: negative except as noted in the HPI  Medications: Current Outpatient Medications  Medication Sig Dispense Refill  . Candesartan Cilexetil-HCTZ 32-25 MG TABS Take 1 tablet by mouth daily. 30 each 1  . Ergocalciferol (VITAMIN D2 PO) Take 5,000 mg by mouth.    . Multiple Vitamins-Minerals (MULTIVITAMIN ADULT PO) Take by mouth.    .  Omega-3 Fatty Acids (FISH OIL) 1000 MG CAPS Take by mouth.     No current facility-administered medications for this visit.    Allergies  Allergen Reactions  . Cortizone-10 [Hydrocortisone]   . Supartz [Sodium Hyaluronate]     Objective:  BP (!) 161/102   Pulse 76   Temp 98.1 F (36.7 C) (Oral)   Resp 16   Ht 5\' 4"  (1.626 m)   Wt (!) 310 lb 9.6 oz (140.9 kg)   LMP  (LMP Unknown)   SpO2 96%   BMI 53.31 kg/m  Gen:  alert, not ill-appearing, no distress, appropriate for age, morbidly obese HEENT: head normocephalic without obvious abnormality, conjunctiva and cornea clear, trachea midline Pulm: Normal work of breathing, normal phonation, clear to auscultation bilaterally, no wheezes, rales or rhonchi CV: Normal rate, regular rhythm, s1 and s2 distinct, no murmurs, clicks or rubs  Neuro: alert and oriented x 3, no tremor MSK: extremities atraumatic, normal gait and station Skin: intact, no rashes on exposed skin, no jaundice, no cyanosis Psych: well-groomed, cooperative, good eye contact, euthymic mood, affect mood-congruent, speech is articulate, and thought processes clear and goal-directed  Depression screen [facility] 2/9 03/08/2017  Decreased Interest  0  Down, Depressed, Hopeless 0  PHQ - 2 Score 0     No results found for this or any previous visit (from the past 72 hour(s)). No results found.    Assessment and Plan: 52 y.o. female with   1. Uncontrolled stage 2 hypertension BP Readings from Last 3 Encounters:  03/08/17 (!) 161/102  09/29/16 (!) 154/90  09/13/16 (!) 154/85  - due for fasting labs - BP is out of range today. Patient ran out of her medication 3 days ago - therapeutic lifestyle changes  - patient works in [place] and has difficulty getting to our office during normal business hours. She will send me her home BP readings over MyChart when she has re-started her medication. Goal <130/80 - if BP is not controlled with max dose ARB/thiazide, recommend  sleep study to screen for OSA. Patient has declined this in the past. - recommended baby aspirin for primary prevention - Candesartan Cilexetil-HCTZ 32-25 MG TABS; Take 1 tablet by mouth daily.  Dispense: 90 each; Refill: 1 - aspirin EC 81 MG tablet; Take 1 tablet (81 mg total) by mouth daily.  Dispense: 90 tablet; Refill: 3 - Comprehensive metabolic panel - CBC - Lipid Panel w/reflex Direct LDL  2. Encounter for medication management   3. Encounter for lipid screening for cardiovascular disease - Lipid Panel w/reflex Direct LDL  4. Screening for diabetes mellitus - Comprehensive metabolic panel - Hemoglobin A1c  5. Screening for blood disease - CBC  6. Class 3 severe obesity due to excess calories with serious comorbidity and body mass index (BMI) of 50.0 to 59.9 in adult [facility]) - counseled on weight loss through decreased caloric intake and increased aerobic exercise  7. Upper airway cough syndrome - re-start Nasacort at bedtime - follow-up if no improvement in 2 weeks  Patient education and anticipatory guidance given Patient agrees with treatment plan Follow-up in 6 months or sooner as needed if symptoms worsen or fail to improve  Darlyne Russian PA-C

## 2017-09-04 ENCOUNTER — Telehealth: Payer: Self-pay

## 2017-09-04 DIAGNOSIS — Z1211 Encounter for screening for malignant neoplasm of colon: Secondary | ICD-10-CM

## 2017-09-04 NOTE — Telephone Encounter (Signed)
Pt is requesting a referral to GI for colonoscopy. -EH/RMA

## 2017-09-04 NOTE — Telephone Encounter (Signed)
Referred to [place] [place] for colon cancer screening

## 2017-09-05 NOTE — Telephone Encounter (Signed)
Pt notified -EH/RMA  

## 2017-09-19 ENCOUNTER — Ambulatory Visit: Payer: BC Managed Care – PPO | Admitting: Physician Assistant

## 2017-09-19 ENCOUNTER — Encounter: Payer: Self-pay | Admitting: Physician Assistant

## 2017-09-19 VITALS — BP 130/78 | HR 82 | Wt 308.0 lb

## 2017-09-19 DIAGNOSIS — Z6841 Body Mass Index (BMI) 40.0 and over, adult: Secondary | ICD-10-CM | POA: Diagnosis not present

## 2017-09-19 DIAGNOSIS — I10 Essential (primary) hypertension: Secondary | ICD-10-CM

## 2017-09-19 NOTE — Patient Instructions (Addendum)
For your blood pressure: - Goal <130/80 - monitor and log blood pressures at home - check around the same time each day in a relaxed setting - Limit salt to <2000 mg/day - Follow DASH eating plan - limit alcohol to 2 standard drinks per day for men and 1 per day for women - avoid tobacco products - weight loss: 7% of current body weight - follow-up every 6 months for your blood pressure    For weight loss: - 1750-2200 calories per day (2 pounds/week versus 1 pound per week) - 30-35% calories from protein - 30% calories from fat - 35-40% from carbs: If diabetic, limit carbs to 30 g per meal and 15 g per snack - MEASURE your calories (MyFitnessPal, LoseIt app of some sort) - 8-11 glasses of water per day - limit caffeine to 1 unsweetened beverage per day - avoid fried foods, saturated fat, and heavily processed foods - keep sugar as low as possible  - follow DASH or Mediterranean diets for recipes - avoid skipping meals. Eat 3 regular meals per day with 1-2 snacks (stay within your calorie goal) OR graze every 4 hours. The important thing is to stay on a schedule - avoid eating within 2 hours of bedtime - research the following medication options: Qsymia, Belviq, and Saxenda     DASH Eating Plan DASH stands for "Dietary Approaches to Stop Hypertension." The DASH eating plan is a healthy eating plan that has been shown to reduce high blood pressure (hypertension). It may also reduce your risk for type 2 diabetes, heart disease, and stroke. The DASH eating plan may also help with weight loss. What are tips for following this plan? General guidelines  Avoid eating more than 2,300 mg (milligrams) of salt (sodium) a day. If you have hypertension, you may need to reduce your sodium intake to 1,500 mg a day.  Limit alcohol intake to no more than 1 drink a day for nonpregnant women and 2 drinks a day for men. One drink equals 12 oz of beer, 5 oz of wine, or 1 oz of hard liquor.  Work  with your health care provider to maintain a healthy body weight or to lose weight. Ask what an ideal weight is for you.  Get at least 30 minutes of exercise that causes your heart to beat faster (aerobic exercise) most days of the week. Activities may include walking, swimming, or biking.  Work with your health care provider or diet and nutrition specialist (dietitian) to adjust your eating plan to your individual calorie needs. Reading food labels  Check food labels for the amount of sodium per serving. Choose foods with less than 5 percent of the Daily Value of sodium. Generally, foods with less than 300 mg of sodium per serving fit into this eating plan.  To find whole grains, look for the word "whole" as the first word in the ingredient list. Shopping  Buy products labeled as "low-sodium" or "no salt added."  Buy fresh foods. Avoid canned foods and premade or frozen meals. Cooking  Avoid adding salt when cooking. Use salt-free seasonings or herbs instead of table salt or sea salt. Check with your health care provider or pharmacist before using salt substitutes.  Do not fry foods. Cook foods using healthy methods such as baking, boiling, grilling, and broiling instead.  Cook with heart-healthy oils, such as olive, canola, soybean, or sunflower oil. Meal planning   Eat a balanced diet that includes: ? 5 or more servings of  fruits and vegetables each day. At each meal, try to fill half of your plate with fruits and vegetables. ? Up to 6-8 servings of whole grains each day. ? Less than 6 oz of lean meat, poultry, or fish each day. A 3-oz serving of meat is about the same size as a deck of cards. One egg equals 1 oz. ? 2 servings of low-fat dairy each day. ? A serving of nuts, seeds, or beans 5 times each week. ? Heart-healthy fats. Healthy fats called Omega-3 fatty acids are found in foods such as flaxseeds and coldwater fish, like sardines, salmon, and mackerel.  Limit how much you  eat of the following: ? Canned or prepackaged foods. ? Food that is high in trans fat, such as fried foods. ? Food that is high in saturated fat, such as fatty meat. ? Sweets, desserts, sugary drinks, and other foods with added sugar. ? Full-fat dairy products.  Do not salt foods before eating.  Try to eat at least 2 vegetarian meals each week.  Eat more home-cooked food and less restaurant, buffet, and fast food.  When eating at a restaurant, ask that your food be prepared with less salt or no salt, if possible. What foods are recommended? The items listed may not be a complete list. Talk with your dietitian about what dietary choices are best for you. Grains Whole-grain or whole-wheat bread. Whole-grain or whole-wheat pasta. Brown rice. [place]. Bulgur. Whole-grain and low-sodium cereals. Pita bread. Low-fat, low-sodium crackers. Whole-wheat flour tortillas. Vegetables Fresh or frozen vegetables (raw, steamed, roasted, or grilled). Low-sodium or reduced-sodium tomato and vegetable juice. Low-sodium or reduced-sodium tomato sauce and tomato paste. Low-sodium or reduced-sodium canned vegetables. Fruits All fresh, dried, or frozen fruit. Canned fruit in natural juice (without added sugar). Meat and other protein foods Skinless chicken or [place]. Ground chicken or [place]. Pork with fat trimmed off. Fish and seafood. Egg whites. Dried beans, peas, or lentils. Unsalted nuts, nut butters, and seeds. Unsalted canned beans. Lean cuts of beef with fat trimmed off. Low-sodium, lean deli meat. Dairy Low-fat (1%) or fat-free (skim) milk. Fat-free, low-fat, or reduced-fat cheeses. Nonfat, low-sodium ricotta or cottage cheese. Low-fat or nonfat yogurt. Low-fat, low-sodium cheese. Fats and oils Soft margarine without trans fats. Vegetable oil. Low-fat, reduced-fat, or light mayonnaise and salad dressings (reduced-sodium). Canola, safflower, olive, soybean, and sunflower oils. Avocado. Seasoning  and other foods Herbs. Spices. Seasoning mixes without salt. Unsalted popcorn and pretzels. Fat-free sweets. What foods are not recommended? The items listed may not be a complete list. Talk with your dietitian about what dietary choices are best for you. Grains Baked goods made with fat, such as croissants, muffins, or some breads. Dry pasta or rice meal packs. Vegetables Creamed or fried vegetables. Vegetables in a cheese sauce. Regular canned vegetables (not low-sodium or reduced-sodium). Regular canned tomato sauce and paste (not low-sodium or reduced-sodium). Regular tomato and vegetable juice (not low-sodium or reduced-sodium). Angie Fava. Olives. Fruits Canned fruit in a light or heavy syrup. Fried fruit. Fruit in cream or butter sauce. Meat and other protein foods Fatty cuts of meat. Ribs. Fried meat. Berniece Salines. Sausage. Bologna and other processed lunch meats. Salami. Fatback. Hotdogs. Bratwurst. Salted nuts and seeds. Canned beans with added salt. Canned or smoked fish. Whole eggs or egg yolks. Chicken or [place] with skin. Dairy Whole or 2% milk, cream, and half-and-half. Whole or full-fat cream cheese. Whole-fat or sweetened yogurt. Full-fat cheese. Nondairy creamers. Whipped toppings. Processed cheese and cheese spreads. Fats  and oils Butter. Stick margarine. Lard. Shortening. Ghee. Bacon fat. Tropical oils, such as coconut, palm kernel, or palm oil. Seasoning and other foods Salted popcorn and pretzels. Onion salt, garlic salt, seasoned salt, table salt, and sea salt. Worcestershire sauce. Tartar sauce. Barbecue sauce. Teriyaki sauce. Soy sauce, including reduced-sodium. Steak sauce. Canned and packaged gravies. Fish sauce. Oyster sauce. Cocktail sauce. Horseradish that you find on the shelf. Ketchup. Mustard. Meat flavorings and tenderizers. Bouillon cubes. Hot sauce and Tabasco sauce. Premade or packaged marinades. Premade or packaged taco seasonings. Relishes. Regular salad  dressings. Where to find more information:  National Heart, Lung, and [place]: https://wilson-eaton.com/  American Heart Association: www.heart.org Summary  The DASH eating plan is a healthy eating plan that has been shown to reduce high blood pressure (hypertension). It may also reduce your risk for type 2 diabetes, heart disease, and stroke.  With the DASH eating plan, you should limit salt (sodium) intake to 2,300 mg a day. If you have hypertension, you may need to reduce your sodium intake to 1,500 mg a day.  When on the DASH eating plan, aim to eat more fresh fruits and vegetables, whole grains, lean proteins, low-fat dairy, and heart-healthy fats.  Work with your health care provider or diet and nutrition specialist (dietitian) to adjust your eating plan to your individual calorie needs. This information is not intended to replace advice given to you by your health care provider. Make sure you discuss any questions you have with your health care provider. Document Released: 02/09/2011 Document Revised: 02/14/2016 Document Reviewed: 02/14/2016 Elsevier Interactive Patient Education  [employer].

## 2017-09-19 NOTE — Progress Notes (Signed)
HPI:                                                                Martha Adkins is a 52 y.o. female who presents to [place]: [facility] today for medication management  HTN:  taking Candesartan-HCTZ daily. Compliant with medications. Checks BP's at home. BP range 107-141/60-88. Denies vision change, headache, chest pain with exertion, orthopnea, lightheadedness, syncope and edema. Risk factors include: obesity   Depression screen [facility] 2/9 03/08/2017  Decreased Interest 0  Down, Depressed, Hopeless 0  PHQ - 2 Score 0    No flowsheet data found.    Past Medical History:  Diagnosis Date  . Eustachian tube dysfunction, right 09/13/2016  . Hypertension goal BP (blood pressure) < 130/80 09/13/2016  . Melanoma ([place]) 1992   back  . Obesity   . Perimenopausal 08/30/2016  . Vitamin D deficiency    Past Surgical History:  Procedure Laterality Date  . ADENOIDECTOMY    . CLOSED REDUCTION FINGER WITH PERCUTANEOUS PINNING    . COLPOSCOPY  1992   laser  . MELANOMA EXCISION     Back   Social History   Tobacco Use  . Smoking status: Passive Smoke Exposure - Never Smoker  . Smokeless tobacco: Never Used  Substance Use Topics  . Alcohol use: Yes    Comment: 2 / month   family history includes COPD in her mother; Hyperlipidemia in her father; Prostate cancer in her father.    ROS: negative except as noted in the HPI  Medications: Current Outpatient Medications  Medication Sig Dispense Refill  . aspirin EC 81 MG tablet Take 1 tablet (81 mg total) by mouth daily. 90 tablet 3  . Candesartan Cilexetil-HCTZ 32-25 MG TABS Take 1 tablet by mouth daily. 90 each 1  . Ergocalciferol (VITAMIN D2 PO) Take 5,000 mg by mouth.    . Multiple Vitamins-Minerals (MULTIVITAMIN ADULT PO) Take by mouth.    . Omega-3 Fatty Acids (FISH OIL) 1000 MG CAPS Take by mouth.     No current facility-administered medications for this visit.    Allergies  Allergen  Reactions  . Cortizone-10 [Hydrocortisone]   . Supartz [Sodium Hyaluronate (Avian)]        Objective:  BP 130/78   Pulse 82   Wt (!) 308 lb (139.7 kg)   BMI 52.87 kg/m  Gen:  alert, not ill-appearing, no distress, appropriate for age, obese female HEENT: head normocephalic without obvious abnormality, conjunctiva and cornea clear, trachea midline Pulm: Normal work of breathing, normal phonation, clear to auscultation bilaterally, no wheezes, rales or rhonchi CV: Normal rate, regular rhythm, s1 and s2 distinct, no murmurs, clicks or rubs  Neuro: alert and oriented x 3, no tremor MSK: extremities atraumatic, normal gait and station, no peripheral edema Skin: intact, no rashes on exposed skin, no jaundice, no cyanosis Psych: well-groomed, cooperative, good eye contact, euthymic mood, affect mood-congruent, speech is articulate, and thought processes clear and goal-directed    No results found for this or any previous visit (from the past 72 hour(s)). No results found.    Assessment and Plan: 52 y.o. female with   Hypertension goal BP (blood pressure) < 130/80  Class 3 severe obesity due to excess calories  with serious comorbidity and body mass index (BMI) of 50.0 to 59.9 in adult [facility])  HTN  - CMP pending. Will refill Candesartan-HCTZ 32-25 pending electrolytes and renal function  Obesity Wt Readings from Last 3 Encounters:  09/19/17 (!) 308 lb (139.7 kg)  03/08/17 (!) 310 lb 9.6 oz (140.9 kg)  09/29/16 (!) 310 lb 14.4 oz (141 kg)  - counseled on 1750-2200 calorie/day DASH diet - she will research Saxenda, Belviq and Qsymia in terms of coverage and adverse effects   Patient education and anticipatory guidance given Patient agrees with treatment plan Follow-up in 6 months or sooner as needed if symptoms worsen or fail to improve  Darlyne Russian PA-C

## 2017-09-20 ENCOUNTER — Other Ambulatory Visit: Payer: Self-pay | Admitting: Physician Assistant

## 2017-09-20 DIAGNOSIS — I10 Essential (primary) hypertension: Secondary | ICD-10-CM

## 2017-09-20 DIAGNOSIS — E1159 Type 2 diabetes mellitus with other circulatory complications: Secondary | ICD-10-CM

## 2017-09-20 LAB — COMPREHENSIVE METABOLIC PANEL
AG Ratio: 1.8 (calc) (ref 1.0–2.5)
ALT: 24 U/L (ref 6–29)
AST: 22 U/L (ref 10–35)
Albumin: 4.6 g/dL (ref 3.6–5.1)
Alkaline phosphatase (APISO): 98 U/L (ref 33–130)
BILIRUBIN TOTAL: 0.4 mg/dL (ref 0.2–1.2)
BUN: 19 mg/dL (ref 7–25)
CALCIUM: 10.1 mg/dL (ref 8.6–10.4)
CO2: 28 mmol/L (ref 20–32)
Chloride: 100 mmol/L (ref 98–110)
Creat: 0.67 mg/dL (ref 0.50–1.05)
Globulin: 2.6 g/dL (calc) (ref 1.9–3.7)
Glucose, Bld: 141 mg/dL — ABNORMAL HIGH (ref 65–99)
Potassium: 3.7 mmol/L (ref 3.5–5.3)
Sodium: 141 mmol/L (ref 135–146)
TOTAL PROTEIN: 7.2 g/dL (ref 6.1–8.1)

## 2017-09-20 LAB — CBC
HEMATOCRIT: 38.8 % (ref 35.0–45.0)
HEMOGLOBIN: 13.6 g/dL (ref 11.7–15.5)
MCH: 28.7 pg (ref 27.0–33.0)
MCHC: 35.1 g/dL (ref 32.0–36.0)
MCV: 81.9 fL (ref 80.0–100.0)
MPV: 10.6 fL (ref 7.5–12.5)
Platelets: 277 10*3/uL (ref 140–400)
RBC: 4.74 10*6/uL (ref 3.80–5.10)
RDW: 13.6 % (ref 11.0–15.0)
WBC: 7.2 10*3/uL (ref 3.8–10.8)

## 2017-09-20 LAB — LIPID PANEL W/REFLEX DIRECT LDL
CHOLESTEROL: 254 mg/dL — AB (ref <200)
HDL: 69 mg/dL (ref >50)
LDL CHOLESTEROL (CALC): 155 mg/dL — AB
Non-HDL Cholesterol (Calc): 185 mg/dL (calc) — ABNORMAL HIGH (ref <130)
TRIGLYCERIDES: 167 mg/dL — AB (ref <150)
Total CHOL/HDL Ratio: 3.7 (calc) (ref <5.0)

## 2017-09-20 LAB — HEMOGLOBIN A1C
HEMOGLOBIN A1C: 6.9 %{Hb} — AB (ref <5.7)
Mean Plasma Glucose: 151 (calc)
eAG (mmol/L): 8.4 (calc)

## 2017-09-20 MED ORDER — METFORMIN HCL 500 MG PO TABS: 500.0000 mg | ORAL_TABLET | Freq: Every day | ORAL | 2 refills | Status: DC

## 2017-09-20 MED ORDER — ATORVASTATIN CALCIUM 20 MG PO TABS: 20.0000 mg | ORAL_TABLET | Freq: Every day | ORAL | 2 refills | Status: DC

## 2017-09-20 MED ORDER — CANDESARTAN CILEXETIL-HCTZ 32-25 MG PO TABS
1.0000 | ORAL_TABLET | Freq: Every day | ORAL | 1 refills | Status: DC
Start: 2017-09-20 — End: 2018-07-18

## 2017-09-20 NOTE — Progress Notes (Signed)
Good morning Martha Adkins,  Your labs show that you have developed type 2 diabetes. Your A1C is 6.9, so this can very easily be controlled with diet and lifestyle changes and low-dose medication. I would like to start you on Metformin, which will slow the progression of insulin resistance. It should be taken twice a day with a meal.  I am also going to refer you to the diabetes educator. I would like you to follow-up in the office within the next month to discuss diabetes diagnosis and preventive care.  Your total and LDL cholesterol are high. With diabetes, LDL goal is <70. I would also like you to start cholesterol-lowering medication - Atorvastatin 20 mg at bedtime.   Best, Evlyn Clines

## 2017-09-20 NOTE — Addendum Note (Signed)
Addended by: Nelson Chimes E on: 09/20/2017 08:54 AM   Modules accepted: Orders

## 2017-10-02 ENCOUNTER — Telehealth: Payer: Self-pay

## 2017-10-02 DIAGNOSIS — E119 Type 2 diabetes mellitus without complications: Secondary | ICD-10-CM

## 2017-10-02 MED ORDER — METFORMIN HCL ER 500 MG PO TB24: 500.0000 mg | ORAL_TABLET | Freq: Every day | ORAL | 0 refills | Status: DC

## 2017-10-02 NOTE — Telephone Encounter (Signed)
Sent Metformin XR 500 mg. She can take with breakfast or supper (patient preference) After 2 weeks, recommend she try to increase to 2 tablets with a meal Recheck A1C in 3 months

## 2017-10-02 NOTE — Telephone Encounter (Signed)
Pt is requesting for a change with her metformin.  She is wanting to try the extended release metformin.   She said that she has taken it in the past during a hospital visit and it didn't upset her stomach as much as the regular metformin.  Please advise. -EH/RMA

## 2017-10-03 NOTE — Telephone Encounter (Signed)
Pt notified of new medication changes.  -EH/RMA

## 2017-10-04 ENCOUNTER — Ambulatory Visit: Payer: BC Managed Care – PPO

## 2017-10-11 ENCOUNTER — Ambulatory Visit: Payer: BC Managed Care – PPO

## 2017-10-18 ENCOUNTER — Ambulatory Visit: Payer: BC Managed Care – PPO

## 2017-10-19 ENCOUNTER — Encounter: Payer: Self-pay | Admitting: Physician Assistant

## 2017-10-22 IMAGING — DX DG ANKLE COMPLETE 3+V*L*
3 series · 3 of 3 positions shown · non-contrast
Comparison: No prior left ankle imaging. Left foot x-rays obtained
concurrently.

CLINICAL DATA: Twisting injury to the left foot and ankle yesterday
while walking. Lateral left foot and ankle pain and swelling.
Initial encounter.

EXAM:
LEFT ANKLE COMPLETE - 3+ VIEW

[ankle ap]
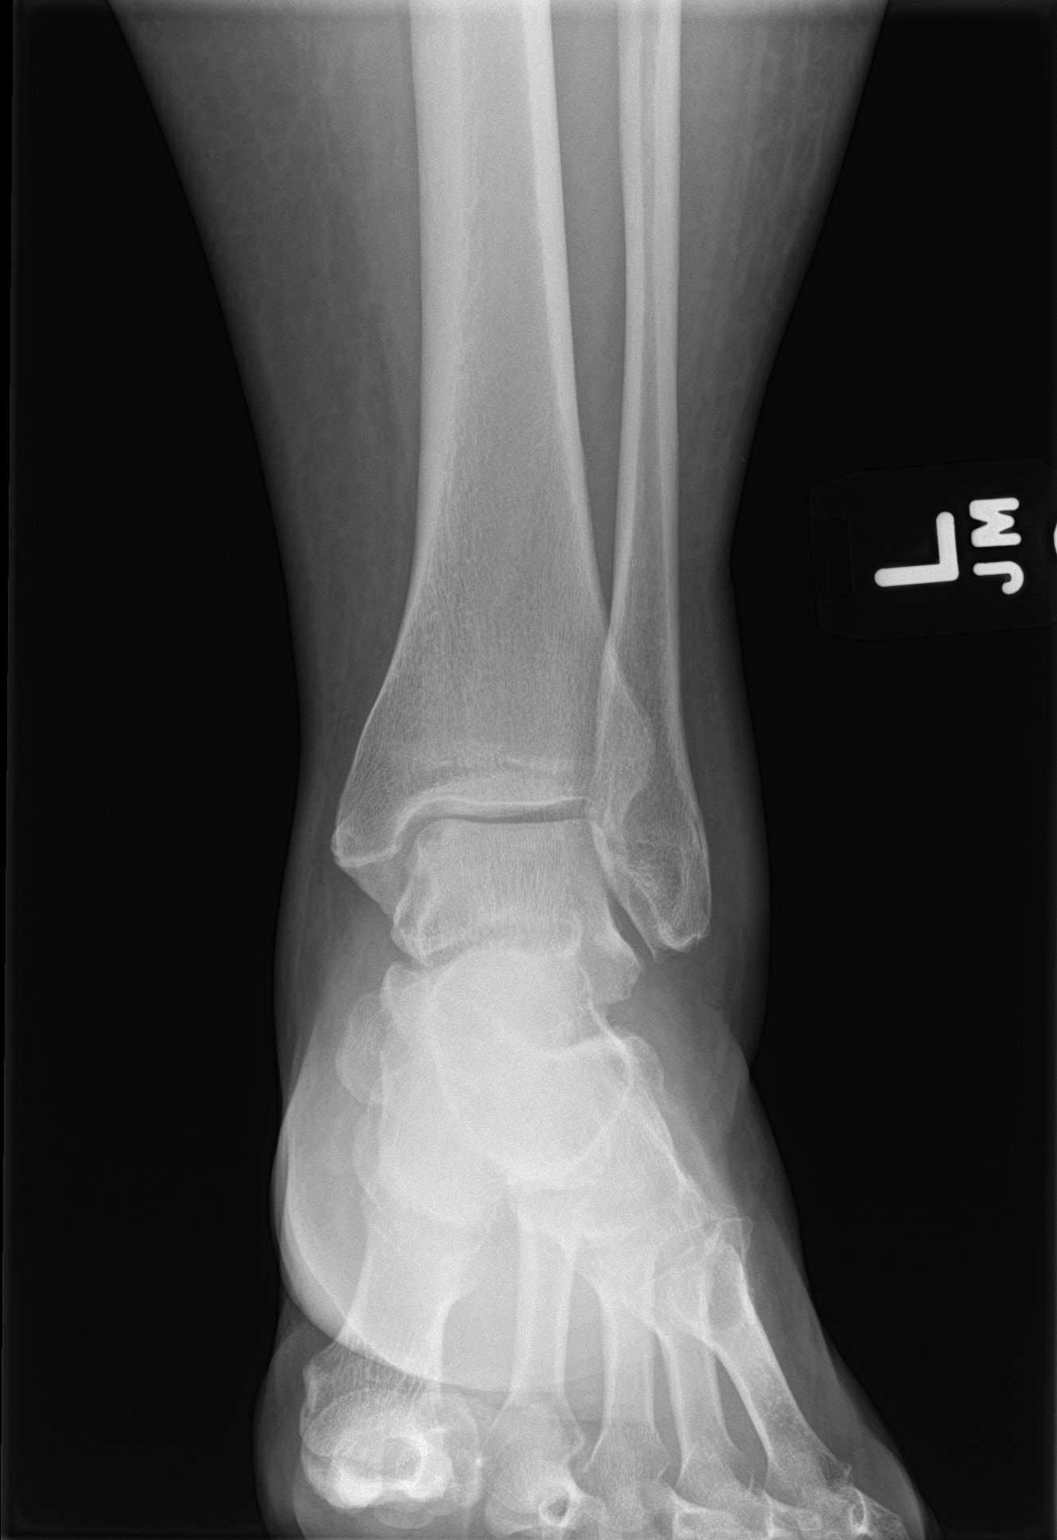

[ankle obl]
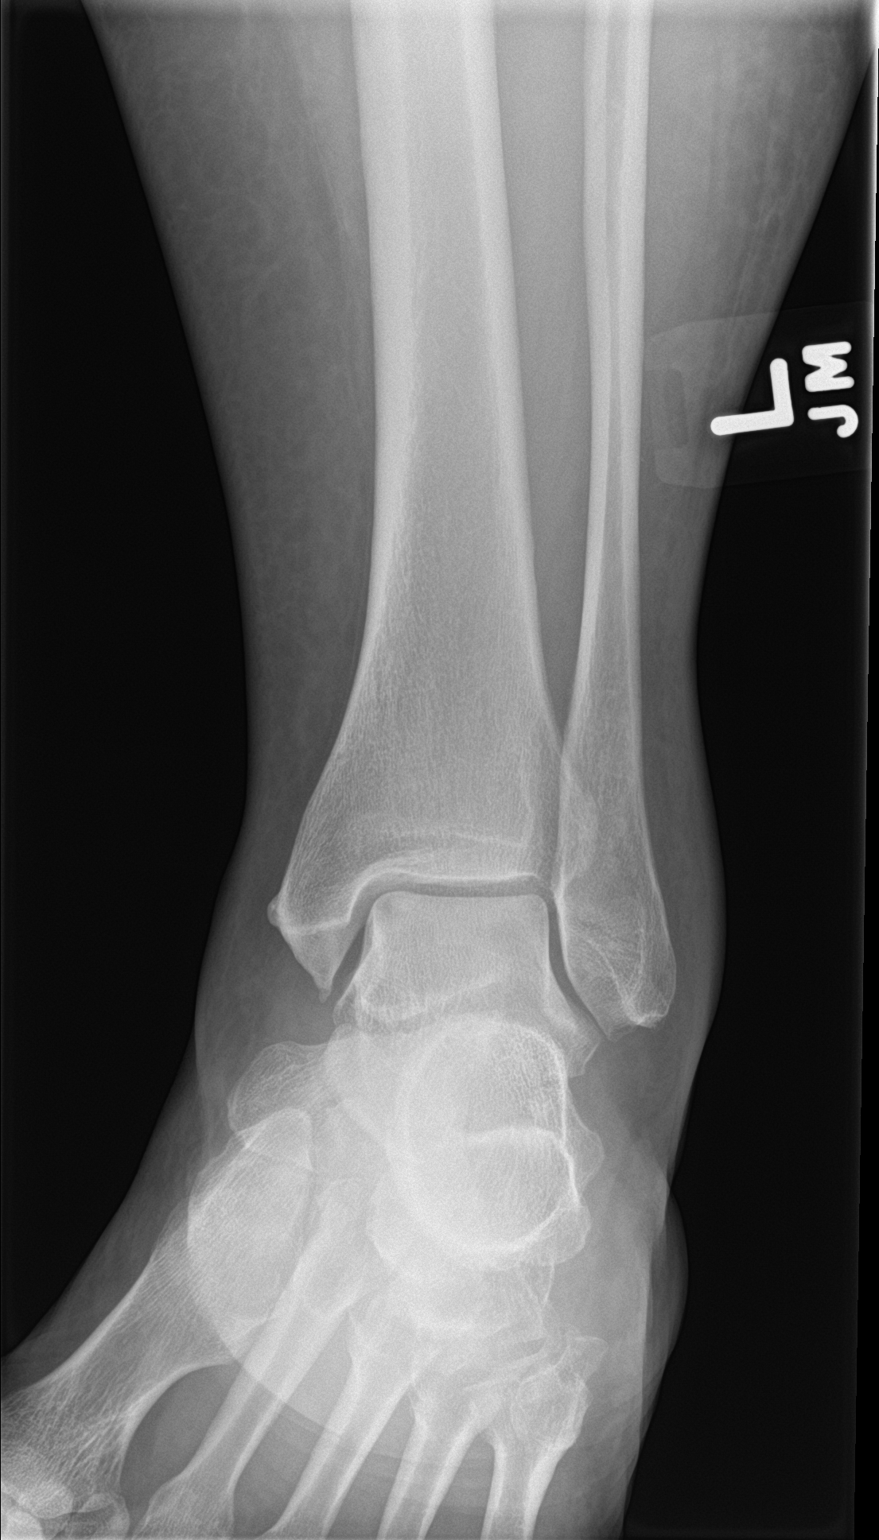

[ankle lat]
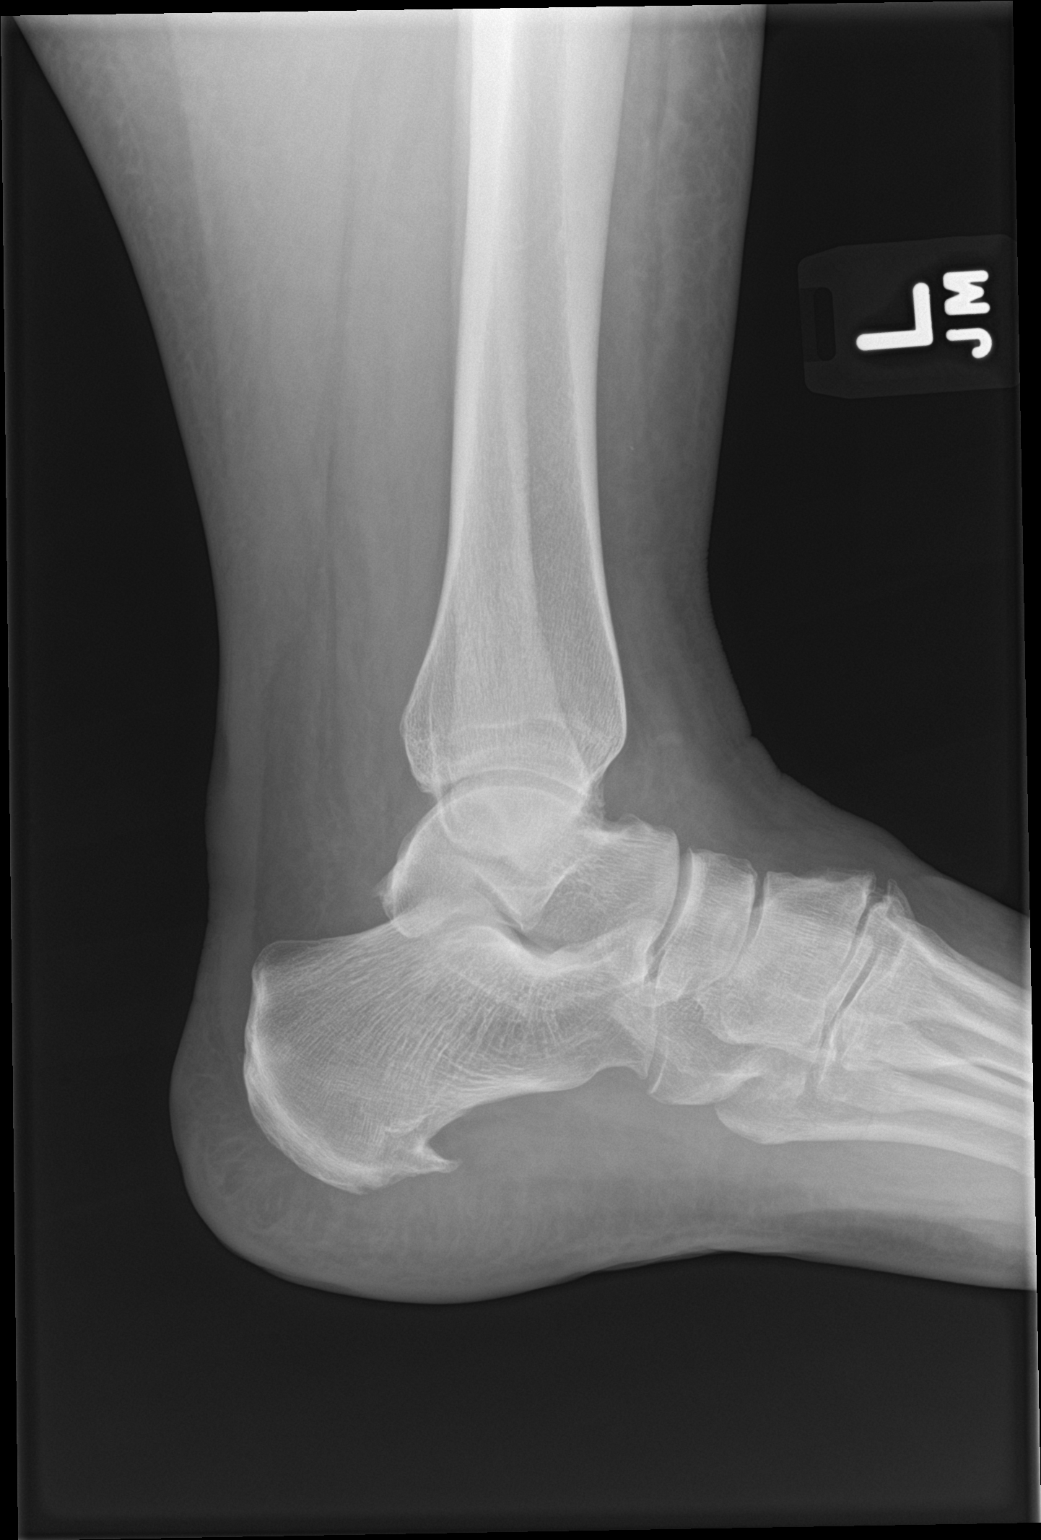

[3 of 3 positions shown; findings below may reference images not displayed]

FINDINGS: Mild lateral soft tissue swelling. No evidence of acute fracture.
Ankle mortise intact with well-preserved joint space. Well-preserved
bone mineral density. No intrinsic osseous abnormalities. No visible
joint effusion.
IMPRESSION: No osseous abnormality involving ankle.

## 2018-01-09 ENCOUNTER — Other Ambulatory Visit: Payer: Self-pay

## 2018-01-09 DIAGNOSIS — E119 Type 2 diabetes mellitus without complications: Secondary | ICD-10-CM

## 2018-01-09 MED ORDER — METFORMIN HCL ER 500 MG PO TB24: 500.0000 mg | ORAL_TABLET | Freq: Every day | ORAL | 0 refills | Status: DC

## 2018-01-15 ENCOUNTER — Encounter: Payer: Self-pay | Admitting: Physician Assistant

## 2018-01-15 ENCOUNTER — Ambulatory Visit: Payer: BC Managed Care – PPO | Admitting: Physician Assistant

## 2018-01-15 VITALS — BP 139/85 | HR 64 | Wt 297.0 lb

## 2018-01-15 DIAGNOSIS — E118 Type 2 diabetes mellitus with unspecified complications: Secondary | ICD-10-CM | POA: Insufficient documentation

## 2018-01-15 DIAGNOSIS — E119 Type 2 diabetes mellitus without complications: Secondary | ICD-10-CM | POA: Diagnosis not present

## 2018-01-15 DIAGNOSIS — M62838 Other muscle spasm: Secondary | ICD-10-CM | POA: Insufficient documentation

## 2018-01-15 DIAGNOSIS — E1159 Type 2 diabetes mellitus with other circulatory complications: Secondary | ICD-10-CM

## 2018-01-15 DIAGNOSIS — M542 Cervicalgia: Secondary | ICD-10-CM | POA: Diagnosis not present

## 2018-01-15 DIAGNOSIS — I10 Essential (primary) hypertension: Secondary | ICD-10-CM

## 2018-01-15 LAB — POCT GLYCOSYLATED HEMOGLOBIN (HGB A1C): HbA1c, POC (prediabetic range): 6 % (ref 5.7–6.4)

## 2018-01-15 MED ORDER — METHOCARBAMOL 500 MG PO TABS: 500.0000 mg | ORAL_TABLET | Freq: Three times a day (TID) | ORAL | 0 refills | Status: DC | PRN

## 2018-01-15 MED ORDER — PREDNISONE 20 MG PO TABS: 40.0000 mg | ORAL_TABLET | Freq: Every day | ORAL | 0 refills | Status: AC

## 2018-01-15 MED ORDER — METFORMIN HCL ER 500 MG PO TB24
500.0000 mg | ORAL_TABLET | Freq: Every day | ORAL | 1 refills | Status: DC
Start: 2018-01-15 — End: 2018-07-18

## 2018-01-15 NOTE — Progress Notes (Signed)
HPI:                                                                Martha Adkins is a 52 y.o. female who presents to [place] today for medication management  HTN: takingCandesartan-HCTZdaily.Compliant with medications.Checks BP's at home. BP fluctuates. Denies vision change, headache, chest painwith exertion, orthopnea, lightheadedness,syncopeand edema. Risk factors include:obesity  DMII: new diagnosis 4 months ago. A1c 6.9. She was started on Metformin XR 500 mg. Compliant with medications.  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 She reduced sodas/sweet tea and is drinking mostly water.  Denies polydipsia, polyuria, polyphagia. Denies blurred vision or vision change. Denies extremity pain, altered sensation and paresthesias.  Denies ulcers/wounds on feet. Hx of DKA/HHS: never Diabetes associated symptoms: none Glucometer: Contour One Blood glucose readings: 117-123 Hypoglycemia frequency: none Severe hypoglycemia (requiring 3rd party assistance): never  Neck pain: new onset right-sided neck pain, "achey," moderate, persistent x 7-9 days. States she always has had intermittent neck stiffness/tightness. Ibuprofen is helpful for a couple of hours. Has been using ice/heat with mild relief. Denies paresthesias or upper extremity weakness.   Past Medical History:  Diagnosis Date  . Eustachian tube dysfunction, right 09/13/2016  . Hypertension goal BP (blood pressure) < 130/80 09/13/2016  . Melanoma ([place]) 1992   back  . Obesity   . Perimenopausal 08/30/2016  . Vitamin D deficiency    Past Surgical History:  Procedure Laterality Date  . ADENOIDECTOMY    . CLOSED REDUCTION FINGER WITH PERCUTANEOUS PINNING    . COLPOSCOPY  1992   laser  . MELANOMA EXCISION     Back   Social History   Tobacco Use  . Smoking status: Passive Smoke  Exposure - Never Smoker  . Smokeless tobacco: Never Used  Substance Use Topics  . Alcohol use: Yes    Comment: 2 / month   family history includes COPD in her mother; Hyperlipidemia in her father; Prostate cancer in her father.    ROS: negative except as noted in the HPI  Medications: Current Outpatient Medications  Medication Sig Dispense Refill  . aspirin EC 81 MG tablet Take 1 tablet (81 mg total) by mouth daily. 90 tablet 3  . atorvastatin (LIPITOR) 20 MG tablet Take 1 tablet (20 mg total) by mouth at bedtime. 30 tablet 2  . Candesartan Cilexetil-HCTZ 32-25 MG TABS Take 1 tablet by mouth daily. 90 each 1  . Ergocalciferol (VITAMIN D2 PO) Take 5,000 mg by mouth.    . metFORMIN (GLUCOPHAGE XR) 500 MG 24 hr tablet Take 1 tablet (500 mg total) by mouth daily with supper. 90 tablet 1  . methocarbamol (ROBAXIN) 500 MG tablet Take 1 tablet (500 mg total) by mouth 3 (three) times daily as needed for muscle spasms. 90 tablet 0  . Multiple Vitamins-Minerals (MULTIVITAMIN ADULT PO) Take by mouth.    . Omega-3 Fatty Acids (FISH OIL) 1000 MG CAPS Take by mouth.    . predniSONE (DELTASONE) 20 MG tablet Take 2 tablets (40 mg total) by mouth daily with breakfast for 5 days. 10 tablet 0   No current facility-administered medications for this visit.  Allergies  Allergen Reactions  . Cortisone Other (See Comments)    "reaction to injection as a child for poison sumac"  . Supartz [Sodium Hyaluronate (Avian)]        Objective:  BP 139/85   Pulse 64   Wt 297 lb (134.7 kg)   BMI 50.98 kg/m  Gen:  alert, not ill-appearing, no distress, appropriate for age, obese female HEENT: head normocephalic without obvious abnormality, conjunctiva and cornea clear, trachea midline Pulm: Normal work of breathing, normal phonation, clear to auscultation bilaterally, no wheezes, rales or rhonchi CV: Normal rate, regular rhythm, s1 and s2 distinct, no murmurs, clicks or rubs  Neuro: alert and oriented x  3, no tremor MSK: extremities atraumatic, grip strength 5/5 symmetric, normal gait and station Neck: atraumatic, no spinous process tenderness, ROM reduced with extension and external rotation, pain reproduced with external rotation Skin: intact, no rashes on exposed skin, no jaundice, no cyanosis Psych: well-groomed, cooperative, good eye contact, euthymic mood, affect mood-congruent, speech is articulate, and thought processes clear and goal-directed  Diabetic Foot Exam - Simple   Simple Foot Form Diabetic Foot exam was performed with the following findings:  Yes 01/15/2018 10:38 AM  Visual Inspection No deformities, no ulcerations, no other skin breakdown bilaterally:  Yes Sensation Testing Intact to touch and monofilament testing bilaterally:  Yes Pulse Check Posterior Tibialis and Dorsalis pulse intact bilaterally:  Yes Comments      Results for orders placed or performed in visit on 01/15/18 (from the past 72 hour(s))  POCT HgB A1C     Status: None   Collection Time: 01/15/18 10:22 AM  Result Value Ref Range   Hemoglobin A1C     HbA1c POC (<> result, manual entry)     HbA1c, POC (prediabetic range) 6.0 5.7 - 6.4 %   HbA1c, POC (controlled diabetic range)     No results found.    Assessment and Plan: 52 y.o. female with   .Martha Adkins was seen today for hyperglycemia.  Diagnoses and all orders for this visit:  Hypertension associated with diabetes ([place])  Controlled type 2 diabetes mellitus without complication, without long-term current use of insulin (H[facility]) -     POCT HgB A1C -     metFORMIN (GLUCOPHAGE XR) 500 MG 24 hr tablet; Take 1 tablet (500 mg total) by mouth daily with supper.  Cervicalgia -     predniSONE (DELTASONE) 20 MG tablet; Take 2 tablets (40 mg total) by mouth daily with breakfast for 5 days. -     methocarbamol (ROBAXIN) 500 MG tablet; Take 1 tablet (500 mg total) by mouth 3 (three) times daily as needed for muscle spasms.  Cervical paraspinal muscle  spasm -     methocarbamol (ROBAXIN) 500 MG tablet; Take 1 tablet (500 mg total) by mouth 3 (three) times daily as needed for muscle spasms.  Diabetes well controlled Cont low dose Metformin XR 500 mg QHS LDL goal <70, cont Atorvastatin 20 mg BP goal <130/80, BP mildly out of range in office today. Cont Candesartan-HCTZ dose is optimized Declines age-recommended vaccinations Normal foot exam Encouraged to schedule dilated eye exam  Cervicalgia - benign exam - prednisone burst  - declined referral to PT - electrostim 30 mins tid, encouraged to purchase TENS unit - follow-up with sports medicine if no improvement in 2 weeks    Patient education and anticipatory guidance given Patient agrees with treatment plan Follow-up in 6 months or sooner as needed if symptoms worsen or fail to improve  Darlyne Russian PA-C

## 2018-01-15 NOTE — Patient Instructions (Signed)
Diabetes Preventive Care: - annual foot exam  - annual dilated eye exam with an eye doctor - self foot exams at least weekly - pneumonia vaccine once (booster in 5 years and at age 52) - annual influenza vaccine - twice yearly dental cleanings and yearly exam - goal blood pressure <140/90, ideally <130/80 - LDL cholesterol <70 - A1C <7.0 - body mass index (BMI) <25.0 - follow-up every 3 months if your A1C is not at goal - follow-up every 6 months if diabetes is well controlled    Diabetes Mellitus and Exercise Exercising regularly is important for your overall health, especially when you have diabetes (diabetes mellitus). Exercising is not only about losing weight. It has many health benefits, such as increasing muscle strength and bone density and reducing body fat and stress. This leads to improved fitness, flexibility, and endurance, all of which result in better overall health. Exercise has additional benefits for people with diabetes, including:  Reducing appetite.  Helping to lower and control blood glucose.  Lowering blood pressure.  Helping to control amounts of fatty substances (lipids) in the blood, such as cholesterol and triglycerides.  Helping the body to respond better to insulin (improving insulin sensitivity).  Reducing how much insulin the body needs.  Decreasing the risk for heart disease by: ? Lowering cholesterol and triglyceride levels. ? Increasing the levels of good cholesterol. ? Lowering blood glucose levels.  What is my activity plan? Your health care provider or certified diabetes educator can help you make a plan for the type and frequency of exercise (activity plan) that works for you. Make sure that you:  Do at least 150 minutes of moderate-intensity or vigorous-intensity exercise each week. This could be brisk walking, biking, or water aerobics. ? Do stretching and strength exercises, such as yoga or weightlifting, at least 2 times a  week. ? Spread out your activity over at least 3 days of the week.  Get some form of physical activity every day. ? Do not go more than 2 days in a row without some kind of physical activity. ? Avoid being inactive for more than 90 minutes at a time. Take frequent breaks to walk or stretch.  Choose a type of exercise or activity that you enjoy, and set realistic goals.  Start slowly, and gradually increase the intensity of your exercise over time.  What do I need to know about managing my diabetes?  Check your blood glucose before and after exercising. ? If your blood glucose is higher than 240 mg/dL (13.3 mmol/L) before you exercise, check your urine for ketones. If you have ketones in your urine, do not exercise until your blood glucose returns to normal.  Know the symptoms of low blood glucose (hypoglycemia) and how to treat it. Your risk for hypoglycemia increases during and after exercise. Common symptoms of hypoglycemia can include: ? Hunger. ? Anxiety. ? Sweating and feeling clammy. ? Confusion. ? Dizziness or feeling light-headed. ? Increased heart rate or palpitations. ? Blurry vision. ? Tingling or numbness around the mouth, lips, or tongue. ? Tremors or shakes. ? Irritability.  Keep a rapid-acting carbohydrate snack available before, during, and after exercise to help prevent or treat hypoglycemia.  Avoid injecting insulin into areas of the body that are going to be exercised. For example, avoid injecting insulin into: ? The arms, when playing tennis. ? The legs, when jogging.  Keep records of your exercise habits. Doing this can help you and your health care provider adjust your  diabetes management plan as needed. Write down: ? Food that you eat before and after you exercise. ? Blood glucose levels before and after you exercise. ? The type and amount of exercise you have done. ? When your insulin is expected to peak, if you use insulin. Avoid exercising at times when  your insulin is peaking.  When you start a new exercise or activity, work with your health care provider to make sure the activity is safe for you, and to adjust your insulin, medicines, or food intake as needed.  Drink plenty of water while you exercise to prevent dehydration or heat stroke. Drink enough fluid to keep your urine clear or pale yellow. This information is not intended to replace advice given to you by your health care provider. Make sure you discuss any questions you have with your health care provider. Document Released: 05/13/2003 Document Revised: 09/10/2015 Document Reviewed: 08/02/2015 Elsevier Interactive Patient Education  2018 [employer].

## 2018-03-05 ENCOUNTER — Ambulatory Visit (INDEPENDENT_AMBULATORY_CARE_PROVIDER_SITE_OTHER): Payer: BC Managed Care – PPO | Admitting: Physician Assistant

## 2018-03-05 ENCOUNTER — Ambulatory Visit (INDEPENDENT_AMBULATORY_CARE_PROVIDER_SITE_OTHER): Payer: BC Managed Care – PPO

## 2018-03-05 ENCOUNTER — Encounter: Payer: Self-pay | Admitting: Physician Assistant

## 2018-03-05 VITALS — BP 140/90 | HR 72 | Temp 98.3°F | Wt 295.0 lb

## 2018-03-05 DIAGNOSIS — J0100 Acute maxillary sinusitis, unspecified: Secondary | ICD-10-CM | POA: Diagnosis not present

## 2018-03-05 DIAGNOSIS — R05 Cough: Secondary | ICD-10-CM

## 2018-03-05 DIAGNOSIS — R058 Other specified cough: Secondary | ICD-10-CM

## 2018-03-05 MED ORDER — IPRATROPIUM BROMIDE 0.06 % NA SOLN: 2.0000 | Freq: Four times a day (QID) | NASAL | 0 refills | Status: DC | PRN

## 2018-03-05 MED ORDER — AMOXICILLIN-POT CLAVULANATE 875-125 MG PO TABS: 1.0000 | ORAL_TABLET | Freq: Two times a day (BID) | ORAL | 0 refills | Status: AC

## 2018-03-05 MED ORDER — GUAIFENESIN ER 600 MG PO TB12: 1200.0000 mg | ORAL_TABLET | Freq: Two times a day (BID) | ORAL | Status: DC

## 2018-03-05 NOTE — Progress Notes (Signed)
HPI:                                                                Martha Adkins is a 52 y.o. female who presents to [facility]: [place] today for cough and sinus pressure  Sinusitis  This is a new problem. The current episode started 1 to 4 weeks ago (x 2 weeks). The problem is unchanged. There has been no fever. The pain is mild. Associated symptoms include congestion, coughing (productive of purulent sputum) and sinus pressure. (+ dental pain) Treatments tried: Dayquil/Nyquil.     Past Medical History:  Diagnosis Date  . Eustachian tube dysfunction, right 09/13/2016  . Hypertension goal BP (blood pressure) < 130/80 09/13/2016  . Melanoma ([place]) 1992   back  . Obesity   . Perimenopausal 08/30/2016  . Vitamin D deficiency    Past Surgical History:  Procedure Laterality Date  . ADENOIDECTOMY    . CLOSED REDUCTION FINGER WITH PERCUTANEOUS PINNING    . COLPOSCOPY  1992   laser  . MELANOMA EXCISION     Back   Social History   Tobacco Use  . Smoking status: Passive Smoke Exposure - Never Smoker  . Smokeless tobacco: Never Used  Substance Use Topics  . Alcohol use: Yes    Comment: 2 / month   family history includes COPD in her mother; Hyperlipidemia in her father; Prostate cancer in her father.    ROS: negative except as noted in the HPI  Medications: Current Outpatient Medications  Medication Sig Dispense Refill  . aspirin EC 81 MG tablet Take 1 tablet (81 mg total) by mouth daily. 90 tablet 3  . atorvastatin (LIPITOR) 20 MG tablet Take 1 tablet (20 mg total) by mouth at bedtime. 30 tablet 2  . Candesartan Cilexetil-HCTZ 32-25 MG TABS Take 1 tablet by mouth daily. 90 each 1  . Ergocalciferol (VITAMIN D2 PO) Take 5,000 mg by mouth.    . metFORMIN (GLUCOPHAGE XR) 500 MG 24 hr tablet Take 1 tablet (500 mg total) by mouth daily with supper. 90 tablet 1  . methocarbamol (ROBAXIN) 500 MG tablet Take 1 tablet (500 mg total) by mouth 3  (three) times daily as needed for muscle spasms. 90 tablet 0  . Multiple Vitamins-Minerals (MULTIVITAMIN ADULT PO) Take by mouth.    . Omega-3 Fatty Acids (FISH OIL) 1000 MG CAPS Take by mouth.     No current facility-administered medications for this visit.    Allergies  Allergen Reactions  . Cortisone Other (See Comments)    "reaction to injection as a child for poison sumac"  . Supartz [Sodium Hyaluronate ([place]ian)]        Objective:  BP 140/90   Pulse 72   Temp 98.3 F (36.8 C) (Oral)   Wt 295 lb (133.8 kg)   SpO2 96%   BMI 50.64 kg/m  Gen:  alert, not ill-appearing, no distress, appropriate for age 51: head normocephalic without obvious abnormality, conjunctiva and cornea clear, TMs pearly gray and semitransparent bilaterally, right-sided maxillary sinus tenderness, nasal mucosa edematous, oropharynx clear, moist mucous membranes, neck supple, no cervical adenopathy trachea midline Pulm: Normal work of breathing, normal phonation, clear to auscultation bilaterally, no wheezes, rales or rhonchi CV: Normal rate,  regular rhythm, s1 and s2 distinct, no murmurs, clicks or rubs  Neuro: alert and oriented x 3, no tremor MSK: extremities atraumatic, normal gait and station Skin: intact, no rashes on exposed skin, no jaundice, no cyanosis    No results found for this or any previous visit (from the past 72 hour(s)). No results found.    Assessment and Plan: 52 y.o. female with  .Desira was seen today for cough.  Diagnoses and all orders for this visit:  Acute non-recurrent maxillary sinusitis -     amoxicillin-clavulanate (AUGMENTIN) 875-125 MG tablet; Take 1 tablet by mouth 2 (two) times daily for 10 days. -     ipratropium (ATROVENT) 0.06 % nasal spray; Place 2 sprays into both nostrils 4 (four) times daily as needed for rhinitis.  Productive cough -     DG Chest 2 View -     guaiFENesin (MUCINEX) 600 MG 12 hr tablet; Take 2 tablets (1,200 mg total) by mouth 2  (two) times daily.     Afebrile, no tachypnea, no tachycardia, pulse ox 96% on room air at rest, no adventitious lung sounds Given persistent cough for 2 weeks with purulent sputum and subjective wheezes and diabetes as a risk fator will obtain chest x-ray to rule out infiltrate Treating empirically for bacterial sinusitis with Augmentin Counseled on supportive care  Patient education and anticipatory guidance given Patient agrees with treatment plan Follow-up as needed if symptoms worsen or fail to improve  Darlyne Russian PA-C

## 2018-03-05 NOTE — Patient Instructions (Addendum)
For nasal symptoms/sinusitis: - nasal saline rinses / netti pot several times per day (do this prior to nasal spray) - prescription Atrovent nasal spray: 2 sprays each nostril, up to 4 times per day as needed - you can use OTC nasal decongestant sprays like Afrin (oxymetazoline) BUT do not use for more than 3 days as it will cause worsening congestion/nasal symptoms) - warm facial compresses - oral decongestants and antihistamines like Claritin-D and Zyrtec-D may help dry up secretions (caution using decongestants if you have high blood pressure, heart disease or kidney disease) - for sinus headache: Tylenol 1000mg  every 8 hours as needed. Alternate with Ibuprofen 600mg  every 6 hours   For cough: - Cough is a protective mechanism and an important part of fighting an infection. I encourage you to avoid suppressing your cough with medication during the day if possible.  - Mucinex with at least 8 oz. of water can loosen chest congestion and make cough more productive, which means you will actually cough less - Okay to use a cough suppressant at bedtime in order to rest (Nyquil, Delsym, Robitussin, etc.)  Note: follow package instructions for all over-the-counter medications. If using multi-symptom medications (Dayquil, Theraflu, etc.), check the label for duplicate drug ingredients.   Sinusitis, Adult Sinusitis is inflammation of your sinuses. Sinuses are hollow spaces in the bones around your face. Your sinuses are located:  Around your eyes.  In the middle of your forehead.  Behind your nose.  In your cheekbones. Mucus normally drains out of your sinuses. When your nasal tissues become inflamed or swollen, mucus can become trapped or blocked. This allows bacteria, viruses, and fungi to grow, which leads to infection. Most infections of the sinuses are caused by a virus. Sinusitis can develop quickly. It can last for up to 4 weeks (acute) or for more than 12 weeks (chronic). Sinusitis often  develops after a cold. What are the causes? This condition is caused by anything that creates swelling in the sinuses or stops mucus from draining. This includes:  Allergies.  Asthma.  Infection from bacteria or viruses.  Deformities or blockages in your nose or sinuses.  Abnormal growths in the nose (nasal polyps).  Pollutants, such as chemicals or irritants in the air.  Infection from fungi (rare). What increases the risk? You are more likely to develop this condition if you:  Have a weak body defense system (immune system).  Do a lot of swimming or diving.  Overuse nasal sprays.  Smoke. What are the signs or symptoms? The main symptoms of this condition are pain and a feeling of pressure around the affected sinuses. Other symptoms include:  Stuffy nose or congestion.  Thick drainage from your nose.  Swelling and warmth over the affected sinuses.  Headache.  Upper toothache.  A cough that may get worse at night.  Extra mucus that collects in the throat or the back of the nose (postnasal drip).  Decreased sense of smell and taste.  Fatigue.  A fever.  Sore throat.  Bad breath. How is this diagnosed? This condition is diagnosed based on:  Your symptoms.  Your medical history.  A physical exam.  Tests to find out if your condition is acute or chronic. This may include: ? Checking your nose for nasal polyps. ? Viewing your sinuses using a device that has a light (endoscope). ? Testing for allergies or bacteria. ? Imaging tests, such as an MRI or CT scan. In rare cases, a bone biopsy may be  done to rule out more serious types of fungal sinus disease. How is this treated? Treatment for sinusitis depends on the cause and whether your condition is chronic or acute.  If caused by a virus, your symptoms should go away on their own within 10 days. You may be given medicines to relieve symptoms. They include: ? Medicines that shrink swollen nasal passages  (topical intranasal decongestants). ? Medicines that treat allergies (antihistamines). ? A spray that eases inflammation of the nostrils (topical intranasal corticosteroids). ? Rinses that help get rid of thick mucus in your nose (nasal saline washes).  If caused by bacteria, your health care provider may recommend waiting to see if your symptoms improve. Most bacterial infections will get better without antibiotic medicine. You may be given antibiotics if you have: ? A severe infection. ? A weak immune system.  If caused by narrow nasal passages or nasal polyps, you may need to have surgery. Follow these instructions at home: Medicines  Take, use, or apply over-the-counter and prescription medicines only as told by your health care provider. These may include nasal sprays.  If you were prescribed an antibiotic medicine, take it as told by your health care provider. Do not stop taking the antibiotic even if you start to feel better. Hydrate and humidify   Drink enough fluid to keep your urine pale yellow. Staying hydrated will help to thin your mucus.  Use a cool mist humidifier to keep the humidity level in your home above 50%.  Inhale steam for 10-15 minutes, 3-4 times a day, or as told by your health care provider. You can do this in the bathroom while a hot shower is running.  Limit your exposure to cool or dry air. Rest  Rest as much as possible.  Sleep with your head raised (elevated).  Make sure you get enough sleep each night. General instructions   Apply a warm, moist washcloth to your face 3-4 times a day or as told by your health care provider. This will help with discomfort.  Wash your hands often with soap and water to reduce your exposure to germs. If soap and water are not available, use hand sanitizer.  Do not smoke. Avoid being around people who are smoking (secondhand smoke).  Keep all follow-up visits as told by your health care provider. This is  important. Contact a health care provider if:  You have a fever.  Your symptoms get worse.  Your symptoms do not improve within 10 days. Get help right away if:  You have a severe headache.  You have persistent vomiting.  You have severe pain or swelling around your face or eyes.  You have vision problems.  You develop confusion.  Your neck is stiff.  You have trouble breathing. Summary  Sinusitis is soreness and inflammation of your sinuses. Sinuses are hollow spaces in the bones around your face.  This condition is caused by nasal tissues that become inflamed or swollen. The swelling traps or blocks the flow of mucus. This allows bacteria, viruses, and fungi to grow, which leads to infection.  If you were prescribed an antibiotic medicine, take it as told by your health care provider. Do not stop taking the antibiotic even if you start to feel better.  Keep all follow-up visits as told by your health care provider. This is important. This information is not intended to replace advice given to you by your health care provider. Make sure you discuss any questions you have with your  health care provider. Document Released: 02/20/2005 Document Revised: 07/23/2017 Document Reviewed: 07/23/2017 Elsevier Interactive Patient Education  2019 [employer].

## 2018-03-07 ENCOUNTER — Encounter: Payer: Self-pay | Admitting: Physician Assistant

## 2018-06-25 ENCOUNTER — Telehealth: Payer: Self-pay | Admitting: Physician Assistant

## 2018-06-25 NOTE — Telephone Encounter (Signed)
Please schedule 6 month diabetes/HTN follow-up for May

## 2018-07-02 NOTE — Telephone Encounter (Signed)
Scheduled -EH/RMA

## 2018-07-18 ENCOUNTER — Ambulatory Visit: Payer: BC Managed Care – PPO | Admitting: Physician Assistant

## 2018-07-18 ENCOUNTER — Encounter: Payer: Self-pay | Admitting: Physician Assistant

## 2018-07-18 VITALS — BP 130/88 | HR 73 | Temp 98.0°F | Wt 303.0 lb

## 2018-07-18 DIAGNOSIS — I10 Essential (primary) hypertension: Secondary | ICD-10-CM

## 2018-07-18 DIAGNOSIS — E1169 Type 2 diabetes mellitus with other specified complication: Secondary | ICD-10-CM | POA: Diagnosis not present

## 2018-07-18 DIAGNOSIS — E119 Type 2 diabetes mellitus without complications: Secondary | ICD-10-CM

## 2018-07-18 DIAGNOSIS — Z1329 Encounter for screening for other suspected endocrine disorder: Secondary | ICD-10-CM

## 2018-07-18 DIAGNOSIS — Z5181 Encounter for therapeutic drug level monitoring: Secondary | ICD-10-CM

## 2018-07-18 DIAGNOSIS — Z1231 Encounter for screening mammogram for malignant neoplasm of breast: Secondary | ICD-10-CM

## 2018-07-18 DIAGNOSIS — R221 Localized swelling, mass and lump, neck: Secondary | ICD-10-CM

## 2018-07-18 DIAGNOSIS — Z6841 Body Mass Index (BMI) 40.0 and over, adult: Secondary | ICD-10-CM

## 2018-07-18 DIAGNOSIS — L659 Nonscarring hair loss, unspecified: Secondary | ICD-10-CM | POA: Insufficient documentation

## 2018-07-18 DIAGNOSIS — E1159 Type 2 diabetes mellitus with other circulatory complications: Secondary | ICD-10-CM

## 2018-07-18 DIAGNOSIS — E785 Hyperlipidemia, unspecified: Secondary | ICD-10-CM

## 2018-07-18 LAB — POCT GLYCOSYLATED HEMOGLOBIN (HGB A1C): HbA1c, POC (controlled diabetic range): 6.9 % (ref 0.0–7.0)

## 2018-07-18 MED ORDER — CANDESARTAN CILEXETIL-HCTZ 32-25 MG PO TABS: 1.0000 | ORAL_TABLET | Freq: Every day | ORAL | 0 refills | Status: DC

## 2018-07-18 MED ORDER — ATORVASTATIN CALCIUM 20 MG PO TABS: 20.0000 mg | ORAL_TABLET | Freq: Every day | ORAL | 1 refills | Status: DC

## 2018-07-18 MED ORDER — METFORMIN HCL ER 500 MG PO TB24: 1000.0000 mg | ORAL_TABLET | Freq: Every day | ORAL | 0 refills | Status: DC

## 2018-07-18 MED ORDER — BLOOD GLUCOSE MONITOR KIT: PACK | 0 refills | Status: AC

## 2018-07-18 NOTE — Patient Instructions (Signed)
Diabetes Preventive Care: - annual foot exam  - annual dilated eye exam with an eye doctor - self foot exams at least weekly - pneumonia vaccine once (booster in 5 years and at age 53) - annual influenza vaccine - twice yearly dental cleanings and yearly exam - goal blood pressure <140/90, ideally <130/80 - LDL cholesterol <70 - A1C <7.0 - body mass index (BMI) <25.0 - follow-up every 3 months if your A1C is not at goal - follow-up every 6 months if diabetes is well controlled   Dulaglutide injection What is this medicine? DULAGLUTIDE (DOO la GLOO tide) is used to improve blood sugar control in adults with type 2 diabetes. This medicine may be used with other oral diabetes medicines. This medicine may be used for other purposes; ask your health care provider or pharmacist if you have questions. COMMON BRAND NAME(S): TRULICITY What should I tell my health care provider before I take this medicine? They need to know if you have any of these conditions: -endocrine tumors (MEN 2) or if someone in your family had these tumors -history of pancreatitis -kidney disease -liver disease -stomach problems -thyroid cancer or if someone in your family had thyroid cancer -an unusual or allergic reaction to dulaglutide, other medicines, foods, dyes, or preservatives -pregnant or trying to get pregnant -breast-feeding How should I use this medicine? This medicine is for injection under the skin of your upper leg (thigh), stomach area, or upper arm. It is usually given once every week (every 7 days). You will be taught how to prepare and give this medicine. Use exactly as directed. Take your medicine at regular intervals. Do not take it more often than directed. If you use this medicine with insulin, you should inject this medicine and the insulin separately. Do not mix them together. Do not give the injections right next to each other. Change (rotate) injection sites with each injection. It is  important that you put your used needles and syringes in a special sharps container. Do not put them in a trash can. If you do not have a sharps container, call your pharmacist or healthcare provider to get one. A special MedGuide will be given to you by the pharmacist with each prescription and refill. Be sure to read this information carefully each time. Talk to your pediatrician regarding the use of this medicine in children. Special care may be needed. Overdosage: If you think you have taken too much of this medicine contact a poison control center or emergency room at once. NOTE: This medicine is only for you. Do not share this medicine with others. What if I miss a dose? If you miss a dose, take it as soon as you can within 3 days after the missed dose. Then take your next dose at your regular weekly time. If it has been longer than 3 days after the missed dose, do not take the missed dose. Take the next dose at your regular time. Do not take double or extra doses. If you have questions about a missed dose, contact your health care provider for advice. What may interact with this medicine? -other medicines for diabetes Many medications may cause changes in blood sugar, these include: -alcohol containing beverages -antiviral medicines for HIV or AIDS -aspirin and aspirin-like drugs -certain medicines for blood pressure, heart disease, irregular heart beat -chromium -diuretics -female hormones, such as estrogens or progestins, birth control pills -fenofibrate -gemfibrozil -isoniazid -lanreotide -female hormones or anabolic steroids -MAOIs like Carbex, Eldepryl, Marplan, Nardil,  and Parnate -medicines for weight loss -medicines for allergies, asthma, cold, or cough -medicines for depression, anxiety, or psychotic disturbances -niacin -nicotine -NSAIDs, medicines for pain and inflammation, like ibuprofen or  naproxen -octreotide -pasireotide -pentamidine -phenytoin -probenecid -quinolone antibiotics such as ciprofloxacin, levofloxacin, ofloxacin -some herbal dietary supplements -steroid medicines such as prednisone or cortisone -sulfamethoxazole; trimethoprim -thyroid hormones Some medications can hide the warning symptoms of low blood sugar (hypoglycemia). You may need to monitor your blood sugar more closely if you are taking one of these medications. These include: -beta-blockers, often used for high blood pressure or heart problems (examples include atenolol, metoprolol, propranolol) -clonidine -guanethidine -reserpine This list may not describe all possible interactions. Give your health care provider a list of all the medicines, herbs, non-prescription drugs, or dietary supplements you use. Also tell them if you smoke, drink alcohol, or use illegal drugs. Some items may interact with your medicine. What should I watch for while using this medicine? Visit your doctor or health care professional for regular checks on your progress. Drink plenty of fluids while taking this medicine. Check with your doctor or health care professional if you get an attack of severe diarrhea, nausea, and vomiting. The loss of too much body fluid can make it dangerous for you to take this medicine. A test called the HbA1C (A1C) will be monitored. This is a simple blood test. It measures your blood sugar control over the last 2 to 3 months. You will receive this test every 3 to 6 months. Learn how to check your blood sugar. Learn the symptoms of low and high blood sugar and how to manage them. Always carry a quick-source of sugar with you in case you have symptoms of low blood sugar. Examples include hard sugar candy or glucose tablets. Make sure others know that you can choke if you eat or drink when you develop serious symptoms of low blood sugar, such as seizures or unconsciousness. They must get medical help at  once. Tell your doctor or health care professional if you have high blood sugar. You might need to change the dose of your medicine. If you are sick or exercising more than usual, you might need to change the dose of your medicine. Do not skip meals. Ask your doctor or health care professional if you should avoid alcohol. Many nonprescription cough and cold products contain sugar or alcohol. These can affect blood sugar. Pens should never be shared. Even if the needle is changed, sharing may result in passing of viruses like hepatitis or HIV. Wear a medical ID bracelet or chain, and carry a card that describes your disease and details of your medicine and dosage times. What side effects may I notice from receiving this medicine? Side effects that you should report to your doctor or health care professional as soon as possible: -allergic reactions like skin rash, itching or hives, swelling of the face, lips, or tongue -breathing problems -diarrhea that continues or is severe -lump or swelling on the neck -severe nausea -signs and symptoms of infection like fever or chills; cough; sore throat; pain or trouble passing urine -signs and symptoms of low blood sugar such as feeling anxious, confusion, dizziness, increased hunger, unusually weak or tired, sweating, shakiness, cold, irritable, headache, blurred vision, fast heartbeat, loss of consciousness -signs and symptoms of kidney injury like trouble passing urine or change in the amount of urine -trouble swallowing -unusual stomach upset or pain -vomiting Side effects that usually do not require medical attention (report  to your doctor or health care professional if they continue or are bothersome): -diarrhea -loss of appetite -nausea -pain, redness, or irritation at site where injected -stomach upset This list may not describe all possible side effects. Call your doctor for medical advice about side effects. You may report side effects to FDA  at 1-800-FDA-1088. Where should I keep my medicine? Keep out of the reach of children. Store unopened pens in a refrigerator between 2 and 8 degrees C (36 and 46 degrees F). Do not freeze or use if the medicine has been frozen. Protect from light and excessive heat. Store in the carton until use. Each single-dose pen can be kept at room temperature, not to exceed 30 degrees C (86 degrees F) for a total of 14 days, if needed. Throw away any unused medicine after the expiration date on the label. NOTE: This sheet is a summary. It may not cover all possible information. If you have questions about this medicine, talk to your doctor, pharmacist, or health care provider.  2019 Elsevier/Gold Standard (2016-03-09 14:35:01)

## 2018-07-18 NOTE — Progress Notes (Signed)
HPI:                                                                Martha Adkins is a 52 y.o. female who presents to [facility]: [facility] today for multiple health concerns  Thyroid - she is requesting to be tested for thyroid disease. She has noticed increasing hair thinning at her part and decreased hair growth overall since turning 50. She also reports difficulty losing weight; while adhering to healthy diet and regular exercise she lost about 6 lbs over a 3 month period. Denies personal or family hx of thyroid disease.  Neck mass - patient states she has noticed a mass of her anterior neck at supraclavicular fossa for the last 7 months. She describes this as feeling like a "soft fatty deposit or piece of cartilage." area is nontender. Denies dysphagia or odynophagia.  DMII: taking Metformin XR 500 mg daily. Compliant with medications. She has been less active lately and not eating as healthy, so she believes her sugar is going to be increased. Denies polydipsia, polyuria, polyphagia. Denies blurred vision or vision change. Denies extremity pain, altered sensation and paresthesias.  Denies ulcers/wounds on feet. Blood glucose readings: 120's-130's; high of 147  HTN: taking Candesartan-HCTZ daily. Compliant with medications.  Denies vision change, headache, chest pain with exertion, orthopnea, lightheadedness, syncope and edema. Risk factors include: DM2, HLD, obesity   Past Medical History:  Diagnosis Date  . Eustachian tube dysfunction, right 09/13/2016  . Hypertension goal BP (blood pressure) < 130/80 09/13/2016  . Melanoma (HCC) 1992   back  . Obesity   . Perimenopausal 08/30/2016  . Vitamin D deficiency    Past Surgical History:  Procedure Laterality Date  . ADENOIDECTOMY    . CLOSED REDUCTION FINGER WITH PERCUTANEOUS PINNING    . COLPOSCOPY  1992   laser  . MELANOMA EXCISION     Back   Social History   Tobacco Use  . Smoking  status: Passive Smoke Exposure - Never Smoker  . Smokeless tobacco: Never Used  Substance Use Topics  . Alcohol use: Yes    Comment: 2 / month   family history includes COPD in her mother; Hyperlipidemia in her father; Prostate cancer in her father.    Review of Systems  Constitutional: Negative for fatigue and unexpected weight change.  HENT: Negative for sore throat and trouble swallowing.   Endocrine: Positive for polyphagia.  Musculoskeletal: Negative for arthralgias.  Skin: Negative for rash.  Hematological: Negative for adenopathy.     Medications: Current Outpatient Medications  Medication Sig Dispense Refill  . atorvastatin (LIPITOR) 20 MG tablet Take 1 tablet (20 mg total) by mouth at bedtime. 90 tablet 1  . Candesartan Cilexetil-HCTZ 32-25 MG TABS Take 1 tablet by mouth daily. 90 each 0  . metFORMIN (GLUCOPHAGE XR) 500 MG 24 hr tablet Take 2 tablets (1,000 mg total) by mouth daily with supper. 180 tablet 0  . Multiple Vitamins-Minerals (MULTIVITAMIN ADULT PO) Take by mouth.    . Omega-3 Fatty Acids (FISH OIL) 1000 MG CAPS Take by mouth.    . blood glucose meter kit and supplies KIT Check morning fasting glucose daily and up to QID prn 1 each 0  . Ergocalciferol (VITAMIN   D2 PO) Take 5,000 mg by mouth.     No current facility-administered medications for this visit.    Allergies  Allergen Reactions  . Cortisone Other (See Comments)    "reaction to injection as a child for poison sumac"  . Supartz [Sodium Hyaluronate (Avian)]        Objective:  BP 130/88   Pulse 73   Temp 98 F (36.7 C) (Oral)   Wt (!) 303 lb (137.4 kg)   BMI 52.01 kg/m  Gen:  alert, not ill-appearing, no distress, appropriate for age, obese female HEENT: head normocephalic without obvious abnormality, conjunctiva and cornea clear, thyroid is nontender, no thyromegaly, no palpable thyroid nodules, neck is supple, no cervical adenopathy, no palpable mass of the supraclavicular fossa, trachea  midline Pulm: Normal work of breathing, normal phonation, clear to auscultation bilaterally, no wheezes, rales or rhonchi CV: Normal rate, regular rhythm, s1 and s2 distinct, no murmurs, clicks or rubs  Neuro: alert and oriented x 3, no tremor MSK: extremities atraumatic, normal gait and station, no peripheral edema Skin: intact, no rashes on exposed skin, no jaundice, no cyanosis Psych: well-groomed, cooperative, good eye contact, euthymic mood, affect mood-congruent, speech is articulate, and thought processes clear and goal-directed  Wt Readings from Last 3 Encounters:  07/18/18 (!) 303 lb (137.4 kg)  03/05/18 295 lb (133.8 kg)  01/15/18 297 lb (134.7 kg)   BP Readings from Last 3 Encounters:  07/18/18 130/88  03/05/18 140/90  01/15/18 139/85    Lab Results  Component Value Date   HGBA1C 6.9 07/18/2018   Lab Results  Component Value Date   CREATININE 0.67 09/19/2017   BUN 19 09/19/2017   NA 141 09/19/2017   K 3.7 09/19/2017   CL 100 09/19/2017   CO2 28 09/19/2017   Lab Results  Component Value Date   ALT 24 09/19/2017   AST 22 09/19/2017   BILITOT 0.4 09/19/2017   Lab Results  Component Value Date   CHOL 254 (H) 09/19/2017   HDL 69 09/19/2017   LDLCALC 155 (H) 09/19/2017   TRIG 167 (H) 09/19/2017   CHOLHDL 3.7 09/19/2017   The 10-year ASCVD risk score (Goff DC Jr., et al., 2013) is: 4.1%   Values used to calculate the score:     Age: 52 years     Sex: Female     Is Non-Hispanic African American: No     Diabetic: Yes     Tobacco smoker: No     Systolic Blood Pressure: 130 mmHg     Is BP treated: Yes     HDL Cholesterol: 69 mg/dL     Total Cholesterol: 254 mg/dL   Assessment and Plan: 52 y.o. female with   .Martha Adkins was seen today for medication management.  Diagnoses and all orders for this visit:  Controlled type 2 diabetes mellitus without complication, without long-term current use of insulin (HCC) -     COMPLETE METABOLIC PANEL WITH GFR -      CBC -     metFORMIN (GLUCOPHAGE XR) 500 MG 24 hr tablet; Take 2 tablets (1,000 mg total) by mouth daily with supper. -     blood glucose meter kit and supplies KIT; Check morning fasting glucose daily and up to QID prn -     POCT HgB A1C  Hypertension associated with diabetes (HCC) -     COMPLETE METABOLIC PANEL WITH GFR -     Candesartan Cilexetil-HCTZ 32-25 MG TABS; Take 1 tablet by mouth   daily.  Hyperlipidemia associated with type 2 diabetes mellitus (HCC) -     atorvastatin (LIPITOR) 20 MG tablet; Take 1 tablet (20 mg total) by mouth at bedtime.  Nonscarring hair loss -     TSH + free T4 -     COMPLETE METABOLIC PANEL WITH GFR -     CBC  Screening for thyroid disorder -     TSH + free T4  Encounter for medication monitoring -     TSH + free T4 -     COMPLETE METABOLIC PANEL WITH GFR -     CBC  Breast cancer screening by mammogram -     MM 3D SCREEN BREAST BILATERAL; Future  Neck mass -     US Soft Tissue Head/Neck   Anterior neck mass No palpable abnormality on exam of the area of concern today We will obtain soft tissue ultrasound of the neck for reassurance The area of concern is actually inferior to the thyroid so I do not believe there is a thyroid nodule  Weight gain, Class 3 Obesity Thyroid studies pending We discussed option to add GLP-1 such as Trulicity or Ozempic.  She does not want to start any medication to assist with weight loss. She was provided with information about Trulicity She is interested in the Mediterranean diet and has done a lot of research on her own about this Encouraged her to eat a Mediterranean diet and encouraged to increase physical activity gradually  Type 2 diabetes A1c is still at goal less than 7, however has increased almost a full point compared to 6 months ago Increasing metformin to 1000 mg daily with largest meal Declined GLP-1 LDL goal less than 70, continue atorvastatin Blood pressure goal less than 130/80, continue  ARB Foot exam up-to-date Reminded to schedule diabetic eye exam  Hypertension Blood pressure in stage I hypertensive range in office today CMP pending Continue current medications Declines baby aspirin for primary prevention    Patient education and anticipatory guidance given Patient agrees with treatment plan Follow-up in 6 months for diabetes, chronic disease management or sooner as needed if symptoms worsen or fail to improve  Charley E. Cummings PA-C  

## 2018-07-19 ENCOUNTER — Other Ambulatory Visit: Payer: Self-pay

## 2018-07-19 ENCOUNTER — Ambulatory Visit (INDEPENDENT_AMBULATORY_CARE_PROVIDER_SITE_OTHER): Payer: BC Managed Care – PPO

## 2018-07-19 DIAGNOSIS — R221 Localized swelling, mass and lump, neck: Secondary | ICD-10-CM | POA: Diagnosis not present

## 2018-07-19 LAB — CBC
HCT: 39.7 % (ref 35.0–45.0)
Hemoglobin: 13.5 g/dL (ref 11.7–15.5)
MCH: 28.4 pg (ref 27.0–33.0)
MCHC: 34 g/dL (ref 32.0–36.0)
MCV: 83.4 fL (ref 80.0–100.0)
MPV: 10.8 fL (ref 7.5–12.5)
Platelets: 297 10*3/uL (ref 140–400)
RBC: 4.76 10*6/uL (ref 3.80–5.10)
RDW: 13.3 % (ref 11.0–15.0)
WBC: 6 10*3/uL (ref 3.8–10.8)

## 2018-07-19 LAB — COMPLETE METABOLIC PANEL WITH GFR
AG Ratio: 1.8 (calc) (ref 1.0–2.5)
ALT: 28 U/L (ref 6–29)
AST: 26 U/L (ref 10–35)
Albumin: 4.6 g/dL (ref 3.6–5.1)
Alkaline phosphatase (APISO): 87 U/L (ref 37–153)
BUN: 18 mg/dL (ref 7–25)
CO2: 28 mmol/L (ref 20–32)
Calcium: 10 mg/dL (ref 8.6–10.4)
Chloride: 101 mmol/L (ref 98–110)
Creat: 0.76 mg/dL (ref 0.50–1.05)
GFR, Est African American: 105 mL/min/{1.73_m2} (ref >=60)
GFR, Est Non African American: 90 mL/min/{1.73_m2} (ref >=60)
Globulin: 2.6 g/dL (calc) (ref 1.9–3.7)
Glucose, Bld: 138 mg/dL — ABNORMAL HIGH (ref 65–99)
Potassium: 3.8 mmol/L (ref 3.5–5.3)
Sodium: 140 mmol/L (ref 135–146)
Total Bilirubin: 0.5 mg/dL (ref 0.2–1.2)
Total Protein: 7.2 g/dL (ref 6.1–8.1)

## 2018-07-19 LAB — TSH+FREE T4: TSH W/REFLEX TO FT4: 4.3 mIU/L

## 2018-08-08 ENCOUNTER — Other Ambulatory Visit: Payer: Self-pay

## 2018-08-08 MED ORDER — GLUCOSE BLOOD VI STRP: ORAL_STRIP | 12 refills | Status: DC

## 2018-10-01 ENCOUNTER — Other Ambulatory Visit: Payer: Self-pay

## 2018-10-01 ENCOUNTER — Encounter: Payer: Self-pay | Admitting: Physician Assistant

## 2018-10-01 ENCOUNTER — Ambulatory Visit (INDEPENDENT_AMBULATORY_CARE_PROVIDER_SITE_OTHER): Payer: BC Managed Care – PPO | Admitting: Physician Assistant

## 2018-10-01 ENCOUNTER — Other Ambulatory Visit: Payer: Self-pay | Admitting: Physician Assistant

## 2018-10-01 VITALS — BP 134/86 | HR 70 | Temp 98.4°F | Wt 306.0 lb

## 2018-10-01 DIAGNOSIS — I10 Essential (primary) hypertension: Secondary | ICD-10-CM

## 2018-10-01 DIAGNOSIS — Z23 Encounter for immunization: Secondary | ICD-10-CM | POA: Diagnosis not present

## 2018-10-01 DIAGNOSIS — Z0289 Encounter for other administrative examinations: Secondary | ICD-10-CM | POA: Diagnosis not present

## 2018-10-01 DIAGNOSIS — Z111 Encounter for screening for respiratory tuberculosis: Secondary | ICD-10-CM | POA: Diagnosis not present

## 2018-10-01 DIAGNOSIS — E1159 Type 2 diabetes mellitus with other circulatory complications: Secondary | ICD-10-CM

## 2018-10-01 DIAGNOSIS — I152 Hypertension secondary to endocrine disorders: Secondary | ICD-10-CM

## 2018-10-01 DIAGNOSIS — Z0184 Encounter for antibody response examination: Secondary | ICD-10-CM

## 2018-10-01 DIAGNOSIS — Z0000 Encounter for general adult medical examination without abnormal findings: Secondary | ICD-10-CM

## 2018-10-01 NOTE — Progress Notes (Signed)
HPI:                                                                Martha Adkins is a 53 y.o. female who presents to [place]: [facility] today for employment physical / immunizations  No current health concerns  Past Medical History:  Diagnosis Date  . Eustachian tube dysfunction, right 09/13/2016  . Hypertension goal BP (blood pressure) < 130/80 09/13/2016  . Melanoma ([place]) 1992   back  . Obesity   . Perimenopausal 08/30/2016  . Vitamin D deficiency    Past Surgical History:  Procedure Laterality Date  . ADENOIDECTOMY    . CLOSED REDUCTION FINGER WITH PERCUTANEOUS PINNING    . COLPOSCOPY  1992   laser  . MELANOMA EXCISION     Back   Social History   Tobacco Use  . Smoking status: Passive Smoke Exposure - Never Smoker  . Smokeless tobacco: Never Used  Substance Use Topics  . Alcohol use: Yes    Comment: 2 / month   family history includes COPD in her mother; Hyperlipidemia in her father; Prostate cancer in her father.    ROS: negative except as noted in the HPI  Medications: Current Outpatient Medications  Medication Sig Dispense Refill  . atorvastatin (LIPITOR) 20 MG tablet Take 1 tablet (20 mg total) by mouth at bedtime. 90 tablet 1  . blood glucose meter kit and supplies KIT Check morning fasting glucose daily and up to QID prn 1 each 0  . Candesartan Cilexetil-HCTZ 32-25 MG TABS Take 1 tablet by mouth daily. 90 each 0  . Ergocalciferol (VITAMIN D2 PO) Take 5,000 mg by mouth.    [name] glucose blood (CONTOUR TEST) test strip Use as instructed 100 each 12  . metFORMIN (GLUCOPHAGE XR) 500 MG 24 hr tablet Take 2 tablets (1,000 mg total) by mouth daily with supper. 180 tablet 0  . Multiple Vitamins-Minerals (MULTIVITAMIN ADULT PO) Take by mouth.    . Omega-3 Fatty Acids (FISH OIL) 1000 MG CAPS Take by mouth.     No current facility-administered medications for this visit.    Allergies  Allergen Reactions  . Cortisone Other  (See Comments)    "reaction to injection as a child for poison sumac"  . Supartz [Sodium Hyaluronate (Avian)]        Objective:  BP (!) 137/91   Pulse 70   Temp 98.4 F (36.9 C) (Oral)   Wt (!) 306 lb (138.8 kg)   BMI 52.52 kg/m  Vitals:   10/01/18 1034  BP: (!) 137/91  Pulse: 70  Temp: 98.4 F (36.9 C)   Wt Readings from Last 3 Encounters:  10/01/18 (!) 306 lb (138.8 kg)  07/18/18 (!) 303 lb (137.4 kg)  03/05/18 295 lb (133.8 kg)   Temp Readings from Last 3 Encounters:  10/01/18 98.4 F (36.9 C) (Oral)  07/18/18 98 F (36.7 C) (Oral)  03/05/18 98.3 F (36.8 C) (Oral)   BP Readings from Last 3 Encounters:  10/01/18 (!) 137/91  07/18/18 130/88  03/05/18 140/90   Pulse Readings from Last 3 Encounters:  10/01/18 70  07/18/18 73  03/05/18 72     Gen:  alert, not ill-appearing, no distress, appropriate for age 29:  head normocephalic without obvious abnormality, conjunctiva and cornea clear, PERRL, TM's pearly gray and semi-transparent, hearing intact to finger rub,  trachea midline Pulm: Normal work of breathing, normal phonation, clear to auscultation bilaterally, no wheezes, rales or rhonchi CV: Normal rate, regular rhythm, s1 and s2 distinct, no murmurs, clicks or rubs  Neuro: alert and oriented x 3, no tremor MSK: extremities atraumatic, normal gait and station Skin: intact, no rashes on exposed skin, no jaundice, no cyanosis Psych: well-groomed, cooperative, good eye contact, euthymic mood, affect mood-congruent, speech is articulate, and thought processes clear and goal-directed    No results found for this or any previous visit (from the past 72 hour(s)). No results found.    Assessment and Plan: 53 y.o. female with   .Diagnoses and all orders for this visit:  Encounter for physical examination related to employment  Immunity status testing -     Hepatitis B surface antibody,quantitative -     Measles/Mumps/Rubella Immunity  Need for  diphtheria-tetanus-pertussis (Tdap) vaccine  Elevated blood pressure reading in office with diagnosis of hypertension  Screening-pulmonary TB    Immunization record reviewed. She has only had 1 MMR vaccine and no record of Hep B vaccines MMR and Hep B immunity status pending Tdap given today PPD placed Form pre-filled and returned to patient. She will bring form with her to nurse visit to be completed  Patient education and anticipatory guidance given Patient agrees with treatment plan Follow-up in 2 days for nurse PPD read or sooner as needed if symptoms worsen or fail to improve  Darlyne Russian PA-C

## 2018-10-02 ENCOUNTER — Encounter: Payer: Self-pay | Admitting: Physician Assistant

## 2018-10-02 DIAGNOSIS — Z23 Encounter for immunization: Secondary | ICD-10-CM | POA: Insufficient documentation

## 2018-10-02 LAB — HEPATITIS B SURFACE ANTIBODY, QUANTITATIVE: Hep B S AB Quant (Post): <5 m[IU]/mL — ABNORMAL LOW (ref >=10)

## 2018-10-02 LAB — MEASLES/MUMPS/RUBELLA IMMUNITY
Mumps IgG: 282 AU/mL
Rubella: 8.74 index
Rubeola IgG: 115 AU/mL

## 2018-10-03 ENCOUNTER — Ambulatory Visit: Payer: BC Managed Care – PPO | Admitting: Physician Assistant

## 2018-10-03 ENCOUNTER — Other Ambulatory Visit: Payer: Self-pay

## 2018-10-03 DIAGNOSIS — Z23 Encounter for immunization: Secondary | ICD-10-CM

## 2018-10-03 LAB — TB SKIN TEST
Induration: 0 mm
TB Skin Test: NEGATIVE

## 2018-10-03 NOTE — Progress Notes (Signed)
Patient was in the office to start Hep B series. Patient denied any fever, headaches, dizziness or shortness of breath.First injection was given today and patient was advised to come back in 30 days for the second one. Rhonda Cunningham,CMA

## 2018-10-06 ENCOUNTER — Encounter: Payer: Self-pay | Admitting: Physician Assistant

## 2018-11-04 ENCOUNTER — Ambulatory Visit (INDEPENDENT_AMBULATORY_CARE_PROVIDER_SITE_OTHER): Payer: BC Managed Care – PPO | Admitting: Physician Assistant

## 2018-11-04 ENCOUNTER — Other Ambulatory Visit: Payer: Self-pay

## 2018-11-04 VITALS — BP 139/83 | HR 69 | Temp 98.1°F | Ht 64.0 in | Wt 312.0 lb

## 2018-11-04 DIAGNOSIS — Z23 Encounter for immunization: Secondary | ICD-10-CM | POA: Diagnosis not present

## 2018-11-04 NOTE — Progress Notes (Signed)
Patient was in office for Hep B vaccine. She tolerated injection well.  ,CMA

## 2018-11-05 ENCOUNTER — Encounter: Payer: Self-pay | Admitting: Physician Assistant

## 2019-01-16 ENCOUNTER — Telehealth: Payer: Self-pay | Admitting: Family Medicine

## 2019-01-16 NOTE — Telephone Encounter (Signed)
Patient was exposed at work and has been quaratining for a few days. She is in need of a test and I let her know that urgent care to get tested. She is aware it can take up to 10 days to get results. Patient was advised of hours and did not have any questions.

## 2019-01-17 ENCOUNTER — Other Ambulatory Visit: Payer: Self-pay

## 2019-01-17 ENCOUNTER — Emergency Department
Admission: EM | Admit: 2019-01-17 | Discharge: 2019-01-17 | Disposition: A | Discharge status: Home / Self Care | Payer: BC Managed Care – PPO | Source: Home / Self Care

## 2019-01-17 DIAGNOSIS — Z20828 Contact with and (suspected) exposure to other viral communicable diseases: Secondary | ICD-10-CM

## 2019-01-17 DIAGNOSIS — Z20822 Contact with and (suspected) exposure to covid-19: Secondary | ICD-10-CM

## 2019-01-17 NOTE — ED Provider Notes (Signed)
[name]    CSN: [id number] Arrival date & time: 01/17/19  0830      History   Chief Complaint Chief Complaint  Patient presents with  . Covid test    HPI Martha Adkins is a 53 y.o. female.   HPI Martha Adkins is a 52 y.o. female presenting to UC with request for a Covid test.  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  They do wear masks at work, however, pt states the school has shut down and advised all teachers/staff to quarantine for 2 weeks due to several positive cases.  Pt denies having symptoms at this time but wants to make sure it is safe for her to be around her family.     Past Medical History:  Diagnosis Date  . Eustachian tube dysfunction, right 09/13/2016  . Hypertension goal BP (blood pressure) < 130/80 09/13/2016  . Melanoma ([place]) 1992   back  . Obesity   . Perimenopausal 08/30/2016  . Vitamin D deficiency     Patient Active Problem List   Diagnosis Date Noted  . Need for hepatitis B vaccination 10/02/2018  . Nonscarring hair loss 07/18/2018  . Hyperlipidemia associated with type 2 diabetes mellitus ([place]) 07/18/2018  . Controlled type 2 diabetes mellitus without complication, without long-term current use of insulin ([place]) 01/15/2018  . Cervicalgia 01/15/2018  . Cervical paraspinal muscle spasm 01/15/2018  . Class 3 severe obesity due to excess calories with serious comorbidity and body mass index (BMI) of 50.0 to 59.9 in adult ([place]) 03/08/2017  . History of melanoma excision 03/08/2017  . Hypertension associated with diabetes ([place]) 09/29/2016  . Hypertension goal BP (blood pressure) < 130/80 09/13/2016  . Eustachian tube dysfunction, right 09/13/2016  . Middle ear effusion, right 09/13/2016  . At risk for obstructive sleep apnea 09/13/2016  . Perimenopausal 08/30/2016  . Elevated blood pressure reading in office with diagnosis of hypertension 08/30/2016    Past  Surgical History:  Procedure Laterality Date  . ADENOIDECTOMY    . CLOSED REDUCTION FINGER WITH PERCUTANEOUS PINNING    . COLPOSCOPY  1992   laser  . MELANOMA EXCISION     Back    OB History   No obstetric history on file.      Home Medications    Prior to Admission medications   Medication Sig Start Date End Date Taking? Authorizing Provider  atorvastatin (LIPITOR) 20 MG tablet Take 1 tablet (20 mg total) by mouth at bedtime. 07/18/18   Trixie Dredge, PA-C  blood glucose meter kit and supplies KIT Check morning fasting glucose daily and up to QID prn 07/18/18   Trixie Dredge, PA-C  Candesartan Cilexetil-HCTZ 32-25 MG TABS TAKE 1 TABLET BY MOUTH DAILY 10/01/18   Trixie Dredge, PA-C  Ergocalciferol (VITAMIN D2 PO) Take 5,000 mg by mouth.    [provider]  glucose blood (CONTOUR TEST) test strip Use as instructed 08/08/18   Trixie Dredge, PA-C  metFORMIN (GLUCOPHAGE XR) 500 MG 24 hr tablet Take 2 tablets (1,000 mg total) by mouth daily with supper. 07/18/18   Trixie Dredge, PA-C  Multiple Vitamins-Minerals (MULTIVITAMIN ADULT PO) Take by mouth.    [provider]  Omega-3 Fatty Acids (FISH OIL) 1000 MG CAPS Take by mouth.    [provider]    Family History Family History  Problem Relation Age of Onset  .  COPD Mother   . Prostate cancer Father   . Hyperlipidemia Father     Social History Social History   Tobacco Use  . Smoking status: Passive Smoke Exposure - Never Smoker  . Smokeless tobacco: Never Used  Substance Use Topics  . Alcohol use: Yes    Comment: 2 / month  . Drug use: No     Allergies   Cortisone and Supartz [sodium hyaluronate (avian)]   Review of Systems Review of Systems  Constitutional: Negative for chills and fever.  HENT: Negative for congestion, ear pain, sore throat, trouble swallowing and voice change.   Respiratory: Negative for cough and  shortness of breath.   Cardiovascular: Negative for chest pain and palpitations.  Gastrointestinal: Negative for abdominal pain, diarrhea, nausea and vomiting.  Musculoskeletal: Negative for arthralgias, back pain and myalgias.  Skin: Negative for rash.  Neurological: Negative for headaches.     Physical Exam Triage Vital Signs ED Triage Vitals  Enc Vitals Group     BP 01/17/19 0856 (!) 145/92     Pulse Rate 01/17/19 0856 74     Resp 01/17/19 0856 18     Temp 01/17/19 0856 98.7 F (37.1 C)     Temp Source 01/17/19 0856 Oral     SpO2 01/17/19 0856 96 %     Weight 01/17/19 0857 (!) 310 lb 13.6 oz (141 kg)     Height 01/17/19 0857 '5\' 4"'$  (1.626 m)     Head Circumference --      Peak Flow --      Pain Score 01/17/19 0857 0     Pain Loc --      Pain Edu? --      Excl. in [place]? --    No data found.  Updated Vital Signs BP (!) 145/92 (BP Location: Right Arm)   Pulse 74   Temp 98.7 F (37.1 C) (Oral)   Resp 18   Ht '5\' 4"'$  (1.626 m)   Wt (!) 310 lb 13.6 oz (141 kg)   SpO2 96%   BMI 53.36 kg/m      Physical Exam Vitals signs and nursing note reviewed.  Constitutional:      Appearance: Normal appearance. She is well-developed.  HENT:     Head: Normocephalic and atraumatic.     Nose: Nose normal.     Mouth/Throat:     Mouth: Mucous membranes are moist.  Neck:     Musculoskeletal: Normal range of motion.  Cardiovascular:     Rate and Rhythm: Normal rate and regular rhythm.  Pulmonary:     Effort: Pulmonary effort is normal. No respiratory distress.     Breath sounds: Normal breath sounds.  Musculoskeletal: Normal range of motion.  Skin:    General: Skin is warm and dry.  Neurological:     Mental Status: She is alert and oriented to person, place, and time.  Psychiatric:        Behavior: Behavior normal.      UC Treatments / Results  Labs (all labs ordered are listed, but only abnormal results are displayed) Labs Reviewed  SARS-COV-2 RNA,(COVID-19)  QUALITATIVE NAAT    EKG   Radiology No results found.  Procedures Procedures (including critical care time)  Medications Ordered in UC Medications - No data to display  Initial Impression / Assessment and Plan / UC Course  I have reviewed the triage vital signs and the nursing notes.  Pertinent labs & imaging results that were available during my  care of the patient were reviewed by me and considered in my medical decision making (see chart for details).     Normal exam Send out Covid test pending AVS provided  Final Clinical Impressions(s) / UC Diagnoses   Final diagnoses:  Close exposure to COVID-19 virus     Discharge Instructions       Due to concern for possibly having Covid-19, it is advised that you self-isolate at home until test results come back, typically in about 2-3 days.  If positive, it is recommended you stay isolated for at least 10 days after symptom onset and 24 hours after last fever without taking medication (whichever is longer), with improving symptoms.  If you MUST go out, please wear a mask at all times, limit contact with others.   Tests are most accurate 5-7 days after exposure. If your test comes back negative but you develop symptoms within the next 1-2 weeks, you may want to be re-tested.  You may check online for drive-through testing sites if additional testing is needed.     ED Prescriptions    None     PDMP not reviewed this encounter.   Noe Gens, [place] 01/17/19 [phone]

## 2019-01-17 NOTE — Discharge Instructions (Signed)
° °  Due to concern for possibly having Covid-19, it is advised that you self-isolate at home until test results come back, typically in about 2-3 days.  If positive, it is recommended you stay isolated for at least 10 days after symptom onset and 24 hours after last fever without taking medication (whichever is longer), with improving symptoms.  If you MUST go out, please wear a mask at all times, limit contact with others.   Tests are most accurate 5-7 days after exposure. If your test comes back negative but you develop symptoms within the next 1-2 weeks, you may want to be re-tested.  You may check online for drive-through testing sites if additional testing is needed.

## 2019-01-17 NOTE — ED Triage Notes (Signed)
Pt here today for a COVID test after finding out coworker tested positive yesterday. 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 Currently not experiencing any sxs. Ok with send out test.

## 2019-01-19 LAB — SARS-COV-2 RNA,(COVID-19) QUALITATIVE NAAT: SARS CoV2 RNA: NOT DETECTED

## 2019-03-10 ENCOUNTER — Emergency Department (INDEPENDENT_AMBULATORY_CARE_PROVIDER_SITE_OTHER)
Admission: EM | Admit: 2019-03-10 | Discharge: 2019-03-10 | Disposition: A | Discharge status: Home / Self Care | Payer: BC Managed Care – PPO | Source: Home / Self Care

## 2019-03-10 ENCOUNTER — Other Ambulatory Visit: Payer: Self-pay

## 2019-03-10 ENCOUNTER — Encounter: Payer: Self-pay | Admitting: *Deleted

## 2019-03-10 DIAGNOSIS — I10 Essential (primary) hypertension: Secondary | ICD-10-CM

## 2019-03-10 DIAGNOSIS — R05 Cough: Secondary | ICD-10-CM

## 2019-03-10 DIAGNOSIS — R059 Cough, unspecified: Secondary | ICD-10-CM

## 2019-03-10 DIAGNOSIS — Z20822 Contact with and (suspected) exposure to covid-19: Secondary | ICD-10-CM

## 2019-03-10 MED ORDER — IPRATROPIUM BROMIDE 0.03 % NA SOLN: 2.0000 | Freq: Two times a day (BID) | NASAL | 0 refills | Status: DC

## 2019-03-10 NOTE — ED Provider Notes (Signed)
[facility]    CSN: [id number] Arrival date & time: 03/10/19  0854      History   Chief Complaint Chief Complaint  Patient presents with  . Cough    HPI Martha Adkins is a 54 y.o. female.   HPI  Patient presents for COVID-19 testing.  Patient has had a known exposure during a recent holiday family gathering with 2 of her family members testing positive for COVID-19.  Symptoms have been mild with body aches and fatigue.  She denies any shortness of breath, wheezing, or productive cough.  She reports developing a low-grade fever earlier today however has treated that with ibuprofen.  Patient is high risk for complications associated with COVID-19 as she has type 2 diabetes, hypertension, and morbid obesity.  Past Medical History:  Diagnosis Date  . Eustachian tube dysfunction, right 09/13/2016  . Hypertension goal BP (blood pressure) < 130/80 09/13/2016  . Melanoma ([place]) 1992   back  . Obesity   . Perimenopausal 08/30/2016  . Vitamin D deficiency     Patient Active Problem List   Diagnosis Date Noted  . Need for hepatitis B vaccination 10/02/2018  . Nonscarring hair loss 07/18/2018  . Hyperlipidemia associated with type 2 diabetes mellitus ([place]) 07/18/2018  . Controlled type 2 diabetes mellitus without complication, without long-term current use of insulin ([place]) 01/15/2018  . Cervicalgia 01/15/2018  . Cervical paraspinal muscle spasm 01/15/2018  . Class 3 severe obesity due to excess calories with serious comorbidity and body mass index (BMI) of 50.0 to 59.9 in adult ([place]) 03/08/2017  . History of melanoma excision 03/08/2017  . Hypertension associated with diabetes ([place]) 09/29/2016  . Hypertension goal BP (blood pressure) < 130/80 09/13/2016  . Eustachian tube dysfunction, right 09/13/2016  . Middle ear effusion, right 09/13/2016  . At risk for obstructive sleep apnea 09/13/2016  . Perimenopausal 08/30/2016  . Elevated blood pressure reading in office with  diagnosis of hypertension 08/30/2016    Past Surgical History:  Procedure Laterality Date  . ADENOIDECTOMY    . CLOSED REDUCTION FINGER WITH PERCUTANEOUS PINNING    . COLPOSCOPY  1992   laser  . MELANOMA EXCISION     Back    OB History   No obstetric history on file.      Home Medications    Prior to Admission medications   Medication Sig Start Date End Date Taking? Authorizing Provider  Candesartan Cilexetil-HCTZ 32-25 MG TABS TAKE 1 TABLET BY MOUTH DAILY 10/01/18  Yes Trixie Dredge, PA-C  Ergocalciferol (VITAMIN D2 PO) Take 5,000 mg by mouth.   Yes [provider]  metFORMIN (GLUCOPHAGE XR) 500 MG 24 hr tablet Take 2 tablets (1,000 mg total) by mouth daily with supper. 07/18/18  Yes Trixie Dredge, PA-C  Multiple Vitamins-Minerals (MULTIVITAMIN ADULT PO) Take by mouth.   Yes [provider]  Omega-3 Fatty Acids (FISH OIL) 1000 MG CAPS Take by mouth.   Yes [provider]  blood glucose meter kit and supplies KIT Check morning fasting glucose daily and up to QID prn 07/18/18   Trixie Dredge, PA-C  glucose blood (CONTOUR TEST) test strip Use as instructed 08/08/18   Trixie Dredge, PA-C    Family History Family History  Problem Relation Age of Onset  . COPD Mother   . Prostate cancer Father   . Hyperlipidemia Father     Social History Social History   Tobacco Use  . Smoking status: Passive Smoke Exposure -  Never Smoker  . Smokeless tobacco: Never Used  Substance Use Topics  . Alcohol use: Yes    Comment: 2 / month  . Drug use: No     Allergies   Cortisone and Supartz [sodium hyaluronate (avian)]   Review of Systems Review of Systems Pertinent negatives listed in HPI  Physical Exam Triage Vital Signs ED Triage Vitals  Enc Vitals Group     BP 03/10/19 0914 (!) 173/97     Pulse Rate 03/10/19 0914 82     Resp 03/10/19 0914 18     Temp 03/10/19 0914 98.4 F (36.9 C)     Temp  Source 03/10/19 0914 Oral     SpO2 03/10/19 0914 96 %     Weight 03/10/19 0910 (!) 305 lb (138.3 kg)     Height 03/10/19 0910 '5\' 4"'$  (1.626 m)     Head Circumference --      Peak Flow --      Pain Score 03/10/19 0910 0     Pain Loc --      Pain Edu? --      Excl. in [place]? --    No data found.  Updated Vital Signs BP (!) 173/97 (BP Location: Right Arm)   Pulse 82   Temp 98.4 F (36.9 C) (Oral)   Resp 18   Ht '5\' 4"'$  (1.626 m)   Wt (!) 305 lb (138.3 kg)   SpO2 96%   BMI 52.35 kg/m   Visual Acuity Right Eye Distance:   Left Eye Distance:   Bilateral Distance:    Right Eye Near:   Left Eye Near:    Bilateral Near:     Physical Exam   UC Treatments / Results  Labs (all labs ordered are listed, but only abnormal results are displayed) Labs Reviewed  NOVEL CORONAVIRUS, NAA    EKG   Radiology No results found.  Procedures Procedures (including critical care time)  Medications Ordered in UC Medications - No data to display  Initial Impression / Assessment and Plan / UC Course  I have reviewed the triage vital signs and the nursing notes.  Pertinent labs & imaging results that were available during my care of the patient were reviewed by me and considered in my medical decision making (see chart for details).   COVID-19 testing pending following a recent exposure. Patient is symptomatic today, although afebrile and no-toxic appearing. I recommend continue management of congestion symptoms. Blood pressure is elevated. Patient has taken morning  Medication. Advised to monitor at home and if no improvement, follow-up with PCP regarding possible medication dose titration. Physical Exam is unremarkable for any concerning findings. COVID-19 education provided. Explained clinic policy for communicating results. Patient verbalized understanding and agreement with plan.  Final Clinical Impressions(s) / UC Diagnoses   Final diagnoses:  Cough  Exposure to COVID-19 virus    Accelerated hypertension     Discharge Instructions     Given your allergy to cortisone I have prescribed Atrovent nasal spray opposed to Flonase to help with nasal congestion.  Use as directed.    ED Prescriptions    Medication Sig Dispense Auth. Provider   ipratropium (ATROVENT) 0.03 % nasal spray Place 2 sprays into both nostrils 2 (two) times daily. 30 mL Scot Jun, FNP     PDMP not reviewed this encounter.   Scot Jun, FNP 03/10/19 1214

## 2019-03-10 NOTE — ED Triage Notes (Signed)
Pt c/o mild cough, temp 99.0, body aches and fatigue x 3 days. COVID exposure.

## 2019-03-10 NOTE — Discharge Instructions (Signed)
Given your allergy to cortisone I have prescribed Atrovent nasal spray opposed to Flonase to help with nasal congestion.  Use as directed.

## 2019-03-12 ENCOUNTER — Telehealth (HOSPITAL_COMMUNITY): Payer: Self-pay | Admitting: Emergency Medicine

## 2019-03-12 LAB — NOVEL CORONAVIRUS, NAA: SARS-CoV-2, NAA: DETECTED — AB

## 2019-03-12 NOTE — Telephone Encounter (Signed)

## 2019-03-14 ENCOUNTER — Telehealth: Payer: Self-pay | Admitting: Neurology

## 2019-03-14 NOTE — Telephone Encounter (Signed)
Patient left vm. Recently diagnosed with Covid-19 and sugars have been elevated (in 200's). She did state she stopped Metformin for 3 days, but is back on this now, and wanted to know if she needed to do anything different to help lower sugars. Please advise.

## 2019-03-14 NOTE — Telephone Encounter (Signed)
Was she given in prednisone? That can cause sugars to elevate as well. Illness in general causes sugars to increase in the body. Stay on metformin. Try to avoid extra sugars, fruits, carbs and eat more protein. Get monitoring sugars and let us know if trending down over then weekend.

## 2019-03-14 NOTE — Telephone Encounter (Signed)
Spoke with patient. She is not on prednisone - no treatment. Symptoms have not been bad, but did forget her Metformin for 3 days. She has taken it yesterday and today. Blood sugars running 250-275. She is going to take medication over the weekend, watch sugars/fruits/carbs and track sugar levels over the weekend. Will call on Monday.

## 2019-04-07 ENCOUNTER — Ambulatory Visit: Payer: BC Managed Care – PPO

## 2019-04-08 LAB — HM DIABETES EYE EXAM

## 2019-04-21 ENCOUNTER — Ambulatory Visit: Payer: BC Managed Care – PPO

## 2019-04-22 ENCOUNTER — Other Ambulatory Visit: Payer: Self-pay

## 2019-04-22 ENCOUNTER — Ambulatory Visit (INDEPENDENT_AMBULATORY_CARE_PROVIDER_SITE_OTHER): Payer: BC Managed Care – PPO | Admitting: Osteopathic Medicine

## 2019-04-22 VITALS — Temp 98.3°F | Ht 64.0 in | Wt 307.0 lb

## 2019-04-22 DIAGNOSIS — Z23 Encounter for immunization: Secondary | ICD-10-CM | POA: Diagnosis not present

## 2019-04-22 NOTE — Progress Notes (Signed)
Patient in office for her last hepatitis B shot. She tolerated the injection well in her left deltoid with no immediate complications.She reports that she tolerated the previous injections well and had a little sore arm afterward.

## 2019-07-29 ENCOUNTER — Telehealth: Payer: Self-pay | Admitting: Neurology

## 2019-07-29 NOTE — Telephone Encounter (Signed)
Patient did not pick up when called for appt.

## 2019-07-29 NOTE — Telephone Encounter (Signed)
Patient left a vm asking about needing refills on blood pressure medications.   [facility] patient. Needs to establish with new PCP  Please call patient to schedule.

## 2019-07-31 ENCOUNTER — Other Ambulatory Visit: Payer: Self-pay | Admitting: Physician Assistant

## 2019-07-31 DIAGNOSIS — E1159 Type 2 diabetes mellitus with other circulatory complications: Secondary | ICD-10-CM

## 2019-08-01 ENCOUNTER — Other Ambulatory Visit: Payer: Self-pay | Admitting: Neurology

## 2019-08-01 DIAGNOSIS — E119 Type 2 diabetes mellitus without complications: Secondary | ICD-10-CM

## 2019-08-01 MED ORDER — METFORMIN HCL ER 500 MG PO TB24: 1000.0000 mg | ORAL_TABLET | Freq: Every day | ORAL | 0 refills | Status: DC

## 2019-08-01 NOTE — Telephone Encounter (Signed)
Call patient to schedule appointment.  Only 30-day supply sent to her pharmacy.

## 2019-08-21 LAB — HM PAP SMEAR: HM Pap smear: NEGATIVE

## 2019-08-21 LAB — HM MAMMOGRAPHY

## 2019-08-25 ENCOUNTER — Encounter: Payer: Self-pay | Admitting: Physician Assistant

## 2019-08-25 ENCOUNTER — Ambulatory Visit (INDEPENDENT_AMBULATORY_CARE_PROVIDER_SITE_OTHER): Payer: BC Managed Care – PPO | Admitting: Physician Assistant

## 2019-08-25 VITALS — BP 122/62 | HR 83 | Ht 64.0 in | Wt 298.0 lb

## 2019-08-25 DIAGNOSIS — E1159 Type 2 diabetes mellitus with other circulatory complications: Secondary | ICD-10-CM

## 2019-08-25 DIAGNOSIS — Z1159 Encounter for screening for other viral diseases: Secondary | ICD-10-CM

## 2019-08-25 DIAGNOSIS — E1165 Type 2 diabetes mellitus with hyperglycemia: Secondary | ICD-10-CM

## 2019-08-25 DIAGNOSIS — E1169 Type 2 diabetes mellitus with other specified complication: Secondary | ICD-10-CM

## 2019-08-25 DIAGNOSIS — E785 Hyperlipidemia, unspecified: Secondary | ICD-10-CM

## 2019-08-25 DIAGNOSIS — Z6841 Body Mass Index (BMI) 40.0 and over, adult: Secondary | ICD-10-CM

## 2019-08-25 DIAGNOSIS — I10 Essential (primary) hypertension: Secondary | ICD-10-CM

## 2019-08-25 DIAGNOSIS — I152 Hypertension secondary to endocrine disorders: Secondary | ICD-10-CM

## 2019-08-25 LAB — POCT GLYCOSYLATED HEMOGLOBIN (HGB A1C): Hemoglobin A1C: 9.4 % — AB (ref 4.0–5.6)

## 2019-08-25 MED ORDER — XIGDUO XR 10-1000 MG PO TB24: 1.0000 | ORAL_TABLET | Freq: Every day | ORAL | 2 refills | Status: DC

## 2019-08-25 MED ORDER — CANDESARTAN CILEXETIL-HCTZ 32-25 MG PO TABS: 1.0000 | ORAL_TABLET | Freq: Every day | ORAL | 1 refills | Status: DC

## 2019-08-25 NOTE — Patient Instructions (Addendum)
Semaglutide injection solution What is this medicine? SEMAGLUTIDE (Sem a GLOO tide) is used to improve blood sugar control in adults with type 2 diabetes. This medicine may be used with other diabetes medicines. This drug may also reduce the risk of heart attack or stroke if you have type 2 diabetes and risk factors for heart disease. This medicine may be used for other purposes; ask your health care provider or pharmacist if you have questions. COMMON BRAND NAME(S): OZEMPIC What should I tell my health care provider before I take this medicine? They need to know if you have any of these conditions:  endocrine tumors (MEN 2) or if someone in your family had these tumors  eye disease, vision problems  history of pancreatitis  kidney disease  stomach problems  thyroid cancer or if someone in your family had thyroid cancer  an unusual or allergic reaction to semaglutide, other medicines, foods, dyes, or preservatives  pregnant or trying to get pregnant  breast-feeding How should I use this medicine? This medicine is for injection under the skin of your upper leg (thigh), stomach area, or upper arm. It is given once every week (every 7 days). You will be taught how to prepare and give this medicine. Use exactly as directed. Take your medicine at regular intervals. Do not take it more often than directed. If you use this medicine with insulin, you should inject this medicine and the insulin separately. Do not mix them together. Do not give the injections right next to each other. Change (rotate) injection sites with each injection. It is important that you put your used needles and syringes in a special sharps container. Do not put them in a trash can. If you do not have a sharps container, call your pharmacist or healthcare provider to get one. A special MedGuide will be given to you by the pharmacist with each prescription and refill. Be sure to read this information carefully each  time. This drug comes with INSTRUCTIONS FOR USE. Ask your pharmacist for directions on how to use this drug. Read the information carefully. Talk to your pharmacist or health care provider if you have questions. Talk to your pediatrician regarding the use of this medicine in children. Special care may be needed. Overdosage: If you think you have taken too much of this medicine contact a poison control center or emergency room at once. NOTE: This medicine is only for you. Do not share this medicine with others. What if I miss a dose? If you miss a dose, take it as soon as you can within 5 days after the missed dose. Then take your next dose at your regular weekly time. If it has been longer than 5 days after the missed dose, do not take the missed dose. Take the next dose at your regular time. Do not take double or extra doses. If you have questions about a missed dose, contact your health care provider for advice. What may interact with this medicine?  other medicines for diabetes Many medications may cause changes in blood sugar, these include:  alcohol containing beverages  antiviral medicines for HIV or AIDS  aspirin and aspirin-like drugs  certain medicines for blood pressure, heart disease, irregular heart beat  chromium  diuretics  female hormones, such as estrogens or progestins, birth control pills  fenofibrate  gemfibrozil  isoniazid  lanreotide  female hormones or anabolic steroids  MAOIs like Carbex, Eldepryl, Marplan, Nardil, and Parnate  medicines for weight loss  medicines for   allergies, asthma, cold, or cough  medicines for depression, anxiety, or psychotic disturbances  niacin  nicotine  NSAIDs, medicines for pain and inflammation, like ibuprofen or naproxen  octreotide  pasireotide  pentamidine  phenytoin  probenecid  quinolone antibiotics such as ciprofloxacin, levofloxacin, ofloxacin  some herbal dietary supplements  steroid medicines  such as prednisone or cortisone  sulfamethoxazole; trimethoprim  thyroid hormones Some medications can hide the warning symptoms of low blood sugar (hypoglycemia). You may need to monitor your blood sugar more closely if you are taking one of these medications. These include:  beta-blockers, often used for high blood pressure or heart problems (examples include atenolol, metoprolol, propranolol)  clonidine  guanethidine  reserpine This list may not describe all possible interactions. Give your health care provider a list of all the medicines, herbs, non-prescription drugs, or dietary supplements you use. Also tell them if you smoke, drink alcohol, or use illegal drugs. Some items may interact with your medicine. What should I watch for while using this medicine? Visit your doctor or health care professional for regular checks on your progress. Drink plenty of fluids while taking this medicine. Check with your doctor or health care professional if you get an attack of severe diarrhea, nausea, and vomiting. The loss of too much body fluid can make it dangerous for you to take this medicine. A test called the HbA1C (A1C) will be monitored. This is a simple blood test. It measures your blood sugar control over the last 2 to 3 months. You will receive this test every 3 to 6 months. Learn how to check your blood sugar. Learn the symptoms of low and high blood sugar and how to manage them. Always carry a quick-source of sugar with you in case you have symptoms of low blood sugar. Examples include hard sugar candy or glucose tablets. Make sure others know that you can choke if you eat or drink when you develop serious symptoms of low blood sugar, such as seizures or unconsciousness. They must get medical help at once. Tell your doctor or health care professional if you have high blood sugar. You might need to change the dose of your medicine. If you are sick or exercising more than usual, you might need  to change the dose of your medicine. Do not skip meals. Ask your doctor or health care professional if you should avoid alcohol. Many nonprescription cough and cold products contain sugar or alcohol. These can affect blood sugar. Pens should never be shared. Even if the needle is changed, sharing may result in passing of viruses like hepatitis or HIV. Wear a medical ID bracelet or chain, and carry a card that describes your disease and details of your medicine and dosage times. Do not become pregnant while taking this medicine. Women should inform their doctor if they wish to become pregnant or think they might be pregnant. There is a potential for serious side effects to an unborn child. Talk to your health care professional or pharmacist for more information. What side effects may I notice from receiving this medicine? Side effects that you should report to your doctor or health care professional as soon as possible:  allergic reactions like skin rash, itching or hives, swelling of the face, lips, or tongue  breathing problems  changes in vision  diarrhea that continues or is severe  lump or swelling on the neck  severe nausea  signs and symptoms of infection like fever or chills; cough; sore throat; pain or trouble   passing urine  signs and symptoms of low blood sugar such as feeling anxious, confusion, dizziness, increased hunger, unusually weak or tired, sweating, shakiness, cold, irritable, headache, blurred vision, fast heartbeat, loss of consciousness  signs and symptoms of kidney injury like trouble passing urine or change in the amount of urine  trouble swallowing  unusual stomach upset or pain  vomiting Side effects that usually do not require medical attention (report to your doctor or health care professional if they continue or are bothersome):  constipation  diarrhea  nausea  pain, redness, or irritation at site where injected  stomach upset This list may not  describe all possible side effects. Call your doctor for medical advice about side effects. You may report side effects to FDA at 1-800-FDA-1088. Where should I keep my medicine? Keep out of the reach of children. Store unopened pens in a refrigerator between 2 and 8 degrees C (36 and 46 degrees F). Do not freeze. Protect from light and heat. After you first use the pen, it can be stored for 56 days at room temperature between 15 and 30 degrees C (59 and 86 degrees F) or in a refrigerator. Throw away your used pen after 56 days or after the expiration date, whichever comes first. Do not store your pen with the needle attached. If the needle is left on, medicine may leak from the pen. NOTE: This sheet is a summary. It may not cover all possible information. If you have questions about this medicine, talk to your doctor, pharmacist, or health care provider.  2020 Elsevier/Gold Standard (2018-11-05 09:41:51) Dapagliflozin; Metformin extended-release tablets What is this medicine? DAPAGLIFLOZIN; METFORMIN (DAP a gli FLOE zin; met FOR min) is a combination of 2 medicines used to treat type 2 diabetes. This medicine lowers blood sugar. Treatment is combined with a balanced diet and exercise. This drug may also be used to reduce the risk of going to the hospital for heart failure if you have type 2 diabetes and risk factors for heart disease. This medicine may be used for other purposes; ask your health care provider or pharmacist if you have questions. COMMON BRAND NAME(S): Xigduo XR What should I tell my health care provider before I take this medicine? They need to know if you have any of these conditions:  anemia  dehydration  diabetic ketoacidosis  diet low in salt  eating less due to illness, surgery, dieting, or any other reason  having surgery  heart disease  history of pancreatitis or pancreas problems  history of yeast infection of the penis or vagina  if you often drink  alcohol  infections in the bladder, kidneys, or urinary tract  kidney disease  liver disease  low blood pressure  polycystic ovary syndrome  problems urinating  serious infection or injury  type 1 diabetes  uncircumcised female  vomiting  an unusual or allergic reaction to dapagliflozin, metformin, other medicines, foods, dyes, or preservatives  pregnant or trying to get pregnant  breast-feeding How should I use this medicine? Take this medicine by mouth with a glass of water. Take this medicine in the morning with food. Follow the directions on the prescription label. Do not cut, crush, or chew this medicine. Take your doses at regular intervals. Do not take your medicine more often than directed. Do not stop taking except on your doctor's advice. A special MedGuide will be given to you by the pharmacist with each prescription and refill. Be sure to read this information carefully each  time. Talk to your pediatrician regarding the use of this medicine in children. Special care may be needed. Overdosage: If you think you have taken too much of this medicine contact a poison control center or emergency room at once. NOTE: This medicine is only for you. Do not share this medicine with others. What if I miss a dose? If you miss a dose, take it as soon as you can. If it is almost time for your next dose, take only that dose. Do not take double or extra doses. What may interact with this medicine? Do not take this medicine with any of the following medications:  certain contrast medicines given before X-rays, CT scans, MRI, or other procedures  dofetilide This medicine may also interact with the following medications:  acetazolamide  alcohol  certain antivirals for HIV or hepatitis  certain medicines for blood pressure, heart disease, irregular heart beat  cimetidine  dichlorphenamide  digoxin  diuretics  female hormones, like estrogens or progestins and birth  control pills  glycopyrrolate  isoniazid  lamotrigine  memantine  methazolamide  metoclopramide  midodrine  niacin  phenobarbital  phenothiazines like chlorpromazine, mesoridazine, prochlorperazine, thioridazine  phenytoin  ranolazine  rifampin  steroid medicines like prednisone or cortisone  stimulant medicines for attention disorders, weight loss, or to stay awake  thyroid medicines  topiramate  trospium  vandetanib  zonisamide This list may not describe all possible interactions. Give your health care provider a list of all the medicines, herbs, non-prescription drugs, or dietary supplements you use. Also tell them if you smoke, drink alcohol, or use illegal drugs. Some items may interact with your medicine. What should I watch for while using this medicine? Visit your doctor or health care professional for regular checks on your progress. This medicine can cause a serious condition in which there is too much acid in the blood. If you develop nausea, vomiting, stomach pain, unusual tiredness, or breathing problems, stop taking this medicine and call your doctor right away. If possible, use a ketone dipstick to check for ketones in your urine. A test called the HbA1C (A1C) will be monitored. This is a simple blood test. It measures your blood sugar control over the last 2 to 3 months. You will receive this test every 3 to 6 months. Learn how to check your blood sugar. Learn the symptoms of low and high blood sugar and how to manage them. Always carry a quick-source of sugar with you in case you have symptoms of low blood sugar. Examples include hard sugar candy or glucose tablets. Make sure others know that you can choke if you eat or drink when you develop serious symptoms of low blood sugar, such as seizures or unconsciousness. They must get medical help at once. Tell your doctor or health care professional if you have high blood sugar. You might need to change the  dose of your medicine. If you are sick or exercising more than usual, you might need to change the dose of your medicine. Do not skip meals. Ask your doctor or health care professional if you should avoid alcohol. Many nonprescription cough and cold products contain sugar or alcohol. These can affect blood sugar. This medicine may cause ovulation in premenopausal women who do not have regular monthly periods. This may increase your chances of becoming pregnant. You should not take this medicine if you become pregnant or think you may be pregnant. Talk with your doctor or health care professional about your birth control options  while taking this medicine. Contact your doctor or health care professional right away if you think you are pregnant. If you are going to need surgery, a MRI, CT scan, or other procedure, tell your doctor that you are taking this medicine. You may need to stop taking this medicine before the procedure. Wear a medical ID bracelet or chain, and carry a card that describes your disease and details of your medicine and dosage times. You may see empty tablets in your stool. This is normal. This medicine may cause a decrease in folic acid and vitamin B12. You should make sure that you get enough vitamins while you are taking this medicine. Discuss the foods you eat and the vitamins you take with your health care professional. What side effects may I notice from receiving this medicine? Side effects that you should report to your doctor or health care professional as soon as possible:  allergic reactions like skin rash, itching or hives, swelling of the face, lips, or tongue  breathing problems  dizziness  feeling faint or lightheaded, falls  muscle aches or pains  muscle weakness  penile discharge, itching, or pain in men  signs and symptoms of a genital infection, such as fever; tenderness, redness, or swelling in the genitals or area from the genitals to the back of the  rectum  signs and symptoms of low blood sugar such as feeling anxious, confusion, dizziness, increased hunger, unusually weak or tired, sweating, shakiness, cold, irritable, headache, blurred vision, fast heartbeat, loss of consciousness  signs and symptoms of a urinary tract infection, such as fever, chills, a burning feeling when urinating, blood in the urine, back pain  trouble passing urine or change in the amount of urine, including an urgent need to urinate more often, in larger amounts, or at night  slow or irregular heartbeat  unusual stomach pain or discomfort  unusually tired or weak  vaginal discharge, itching, or odor in women  vomiting Side effects that usually do not require medical attention (report to your doctor or health care professional if they continue or are bothersome):  diarrhea  headache  heartburn  metallic taste in mouth  mild increase in urination  nausea  stomach gas, upset  thirsty This list may not describe all possible side effects. Call your doctor for medical advice about side effects. You may report side effects to FDA at 1-800-FDA-1088. Where should I keep my medicine? Keep out of the reach of children. Store at room temperature between 15 and 30 degrees C (59 and 86 degrees F). Throw away any unused medicine after the expiration date. NOTE: This sheet is a summary. It may not cover all possible information. If you have questions about this medicine, talk to your doctor, pharmacist, or health care provider.  2020 Elsevier/Gold Standard (2018-01-04 15:19:09)

## 2019-08-25 NOTE — Progress Notes (Signed)
Subjective:    Patient ID: Martha Adkins, female    DOB: 1965/04/23, 54 y.o.   MRN: [id number]  HPI  Pt is a 54 yo obese female with T2DM, HTN, HLD who presents to the clinic to get re-established after PCP left clinic.   Pt has not been seen in quite a while. Her a1c was in the 6 range when she last had done. She is checking her sugars at home and noting elevation. Sugars as high as 300s. She started taking metformin 2 tablets daily. She denies any vision changes, hypoglycemia, open sores or wounds, headaches, fatigue, excessive thirst.  Patient admits she has not been exercising and her diet has not been well.  She has kept up with her yearly eye appointments.  She denies any chest pain, palpitations, headaches, shortness of breath.  She is taking her blood pressure medication daily.  She has declined use of statins in the past.  .. Active Ambulatory Problems    Diagnosis Date Noted  . Perimenopausal 08/30/2016  . Elevated blood pressure reading in office with diagnosis of hypertension 08/30/2016  . Hypertension goal BP (blood pressure) < 130/80 09/13/2016  . Eustachian tube dysfunction, right 09/13/2016  . At risk for obstructive sleep apnea 09/13/2016  . Hypertension associated with diabetes ([place]) 09/29/2016  . Class 3 severe obesity due to excess calories with serious comorbidity and body mass index (BMI) of 50.0 to 59.9 in adult ([place]) 03/08/2017  . History of melanoma excision 03/08/2017  . Controlled type 2 diabetes mellitus without complication, without long-term current use of insulin ([place]) 01/15/2018  . Cervicalgia 01/15/2018  . Cervical paraspinal muscle spasm 01/15/2018  . Nonscarring hair loss 07/18/2018  . Hyperlipidemia associated with type 2 diabetes mellitus ([place]) 07/18/2018   Resolved Ambulatory Problems    Diagnosis Date Noted  . Middle ear effusion, right 09/13/2016  . Need for hepatitis B vaccination 10/02/2018   Past Medical History:  Diagnosis Date  .  Melanoma ([place]) 1992  . Obesity   . Vitamin D deficiency      Review of Systems See HPI.     Objective:   Physical Exam Vitals reviewed.  Constitutional:      Appearance: Normal appearance. She is obese.  Cardiovascular:     Rate and Rhythm: Normal rate and regular rhythm.     Pulses: Normal pulses.     Heart sounds: No murmur heard.   Pulmonary:     Effort: Pulmonary effort is normal.     Breath sounds: Normal breath sounds.  Neurological:     General: No focal deficit present.     Mental Status: She is alert and oriented to person, place, and time.  Psychiatric:        Mood and Affect: Mood normal.           Assessment & Plan:  Marland KitchenMarland KitchenElesia was seen today for establish care.  Diagnoses and all orders for this visit:  Uncontrolled type 2 diabetes mellitus with hyperglycemia (HCC) -     Dapagliflozin-metFORMIN HCl ER (XIGDUO XR) 12-998 MG TB24; Take 1 tablet by mouth daily.  Hypertension associated with diabetes ([place]) -     POCT HgB A1C -     COMPLETE METABOLIC PANEL WITH GFR -     Candesartan Cilexetil-HCTZ 32-25 MG TABS; Take 1 tablet by mouth daily.  Hyperlipidemia associated with type 2 diabetes mellitus (HCC) -     COMPLETE METABOLIC PANEL WITH GFR -     Lipid Panel  w/reflex Direct LDL  Encounter for hepatitis C screening test for low risk patient -     Hepatitis C Antibody  Hypertension goal BP (blood pressure) < 130/80  Class 3 severe obesity due to excess calories with serious comorbidity and body mass index (BMI) of 50.0 to 59.9 in adult [facility])   .[name] Results for orders placed or performed in visit on 08/25/19  POCT HgB A1C  Result Value Ref Range   Hemoglobin A1C 9.4 (A) 4.0 - 5.6 %   HbA1c POC (<> result, manual entry)     HbA1c, POC (prediabetic range)     HbA1c, POC (controlled diabetic range)     A1c is not stable today.  It is up from 6.4-9.4. We discussed diet and exercise changes.  I would also like to make some medication changes. I  mentioned adding Ozempic or some other GLP-1.  She would like more information on this.  Handout was given. I did encourage her to try a combination Metformin/GLT 2.  We opted on is to do a.  I gave her the risk and benefits of medication. We will attempt to get up-to-date eye exam Patient declined statin today.  She is aware that LDL goal is under 70 and that all diabetics are encouraged to take a statin.  She is aware of risk versus benefits. Patient is on a ACE.  Blood pressure to goal Patient declined pneumonia vaccine today.  Mammogram and Pap done at [facility]. Will call for records.   Spent 30 minutes with patient.

## 2019-09-04 ENCOUNTER — Encounter: Payer: Self-pay | Admitting: Physician Assistant

## 2019-09-09 ENCOUNTER — Encounter: Payer: Self-pay | Admitting: Physician Assistant

## 2019-11-25 ENCOUNTER — Ambulatory Visit (INDEPENDENT_AMBULATORY_CARE_PROVIDER_SITE_OTHER): Payer: BC Managed Care – PPO | Admitting: Physician Assistant

## 2019-11-25 ENCOUNTER — Encounter: Payer: Self-pay | Admitting: Physician Assistant

## 2019-11-25 ENCOUNTER — Other Ambulatory Visit: Payer: Self-pay

## 2019-11-25 VITALS — BP 134/71 | HR 71 | Ht 64.0 in | Wt 292.0 lb

## 2019-11-25 DIAGNOSIS — I152 Hypertension secondary to endocrine disorders: Secondary | ICD-10-CM

## 2019-11-25 DIAGNOSIS — Z1159 Encounter for screening for other viral diseases: Secondary | ICD-10-CM

## 2019-11-25 DIAGNOSIS — E1159 Type 2 diabetes mellitus with other circulatory complications: Secondary | ICD-10-CM

## 2019-11-25 DIAGNOSIS — Z13220 Encounter for screening for lipoid disorders: Secondary | ICD-10-CM

## 2019-11-25 DIAGNOSIS — I10 Essential (primary) hypertension: Secondary | ICD-10-CM

## 2019-11-25 DIAGNOSIS — E1165 Type 2 diabetes mellitus with hyperglycemia: Secondary | ICD-10-CM | POA: Diagnosis not present

## 2019-11-25 DIAGNOSIS — Z131 Encounter for screening for diabetes mellitus: Secondary | ICD-10-CM

## 2019-11-25 DIAGNOSIS — E1169 Type 2 diabetes mellitus with other specified complication: Secondary | ICD-10-CM

## 2019-11-25 DIAGNOSIS — E785 Hyperlipidemia, unspecified: Secondary | ICD-10-CM

## 2019-11-25 MED ORDER — XIGDUO XR 10-1000 MG PO TB24: 1.0000 | ORAL_TABLET | Freq: Every day | ORAL | 2 refills | Status: DC

## 2019-11-25 MED ORDER — CONTOUR TEST VI STRP: ORAL_STRIP | 12 refills | Status: AC

## 2019-11-25 NOTE — Progress Notes (Signed)
Subjective:    Patient ID: Martha Adkins, female    DOB: 03-14-1965, 54 y.o.   MRN: [id number]  HPI  Patient is a 54 year old obese female with type 2 diabetes, hypertension, hyperlipidemia who presents to the clinic for her 29-month follow-up.  Patient did start to do well 3 months ago when her A1c was 9.4.  She has tolerated it very well.  She certainly noticed when she is checking her sugars the sugar decreased.  She would estimate by about 40 points since starting.  Most of her sugars are ranging in the low 130s to 150s.  She denies any hypoglycemic events.  She denies any open sores or wounds.  She is trying to be more active.  She has lost 10 pounds in 3 months.  She is trying to eat cleaner as well.  She does endorse having more energy and feeling better.   .. Active Ambulatory Problems    Diagnosis Date Noted  . Perimenopausal 08/30/2016  . Elevated blood pressure reading in office with diagnosis of hypertension 08/30/2016  . Hypertension goal BP (blood pressure) < 130/80 09/13/2016  . Eustachian tube dysfunction, right 09/13/2016  . At risk for obstructive sleep apnea 09/13/2016  . Hypertension associated with diabetes ([place]) 09/29/2016  . Class 3 severe obesity due to excess calories with serious comorbidity and body mass index (BMI) of 50.0 to 59.9 in adult ([place]) 03/08/2017  . History of melanoma excision 03/08/2017  . Controlled type 2 diabetes mellitus without complication, without long-term current use of insulin ([place]) 01/15/2018  . Cervicalgia 01/15/2018  . Cervical paraspinal muscle spasm 01/15/2018  . Nonscarring hair loss 07/18/2018  . Hyperlipidemia associated with type 2 diabetes mellitus ([place]) 07/18/2018   Resolved Ambulatory Problems    Diagnosis Date Noted  . Middle ear effusion, right 09/13/2016  . Need for hepatitis B vaccination 10/02/2018   Past Medical History:  Diagnosis Date  . Melanoma ([place]) 1992  . Obesity   . Vitamin D deficiency      Review  of Systems  All other systems reviewed and are negative.      Objective:   Physical Exam Vitals reviewed.  Constitutional:      Appearance: Normal appearance. She is obese.  Neck:     Vascular: No carotid bruit.  Cardiovascular:     Rate and Rhythm: Normal rate and regular rhythm.     Pulses: Normal pulses.  Pulmonary:     Effort: Pulmonary effort is normal.  Neurological:     General: No focal deficit present.     Mental Status: She is alert and oriented to person, place, and time.  Psychiatric:        Mood and Affect: Mood normal.           Assessment & Plan:  Marland KitchenMarland KitchenDiagnoses and all orders for this visit:  Uncontrolled type 2 diabetes mellitus with hyperglycemia (HCC) -     Dapagliflozin-metFORMIN HCl ER (XIGDUO XR) 12-998 MG TB24; Take 1 tablet by mouth daily. -     glucose blood (CONTOUR TEST) test strip; Use as instructed -     POCT HgB A1C  Screening for lipid disorders -     Lipid Panel w/reflex Direct LDL  Screening for diabetes mellitus -     COMPLETE METABOLIC PANEL WITH GFR  Encounter for hepatitis C screening test for low risk patient -     Hepatitis C Antibody  Morbid obesity ([place]) -     TSH  Hyperlipidemia  associated with type 2 diabetes mellitus ([place])  Hypertension associated with diabetes ([place])   .. Lab Results  Component Value Date   HGBA1C 7.1 (A) 11/25/2019   A1C is better but not quite to goal.  Continue diet and exercise changes.  Continue xigduo pt is tolerating well.  On ARB. BP very close to goal.  Ordered lipid level. Pt declined statin.  Will call for eye exam.  Foot exam UTD. Pt declined flu and pneumonia vaccine.   Screening labs ordered today.   [name].Discussed low carb diet with 1500 calories and 80g of protein.  Exercising at least 150 minutes a week.  My Fitness Pal could be a Microbiologist.

## 2019-11-27 LAB — POCT GLYCOSYLATED HEMOGLOBIN (HGB A1C): Hemoglobin A1C: 7.1 % — AB (ref 4.0–5.6)

## 2020-02-09 ENCOUNTER — Other Ambulatory Visit: Payer: Self-pay | Admitting: Physician Assistant

## 2020-02-09 DIAGNOSIS — E1165 Type 2 diabetes mellitus with hyperglycemia: Secondary | ICD-10-CM

## 2020-02-24 ENCOUNTER — Ambulatory Visit: Payer: BC Managed Care – PPO | Admitting: Physician Assistant

## 2020-03-01 IMAGING — DX DG CHEST 2V
2 series · 2 of 2 positions shown · non-contrast
Comparison: None.

CLINICAL DATA: Productive cough for 2 weeks

EXAM:
CHEST - 2 VIEW

[chest pa]
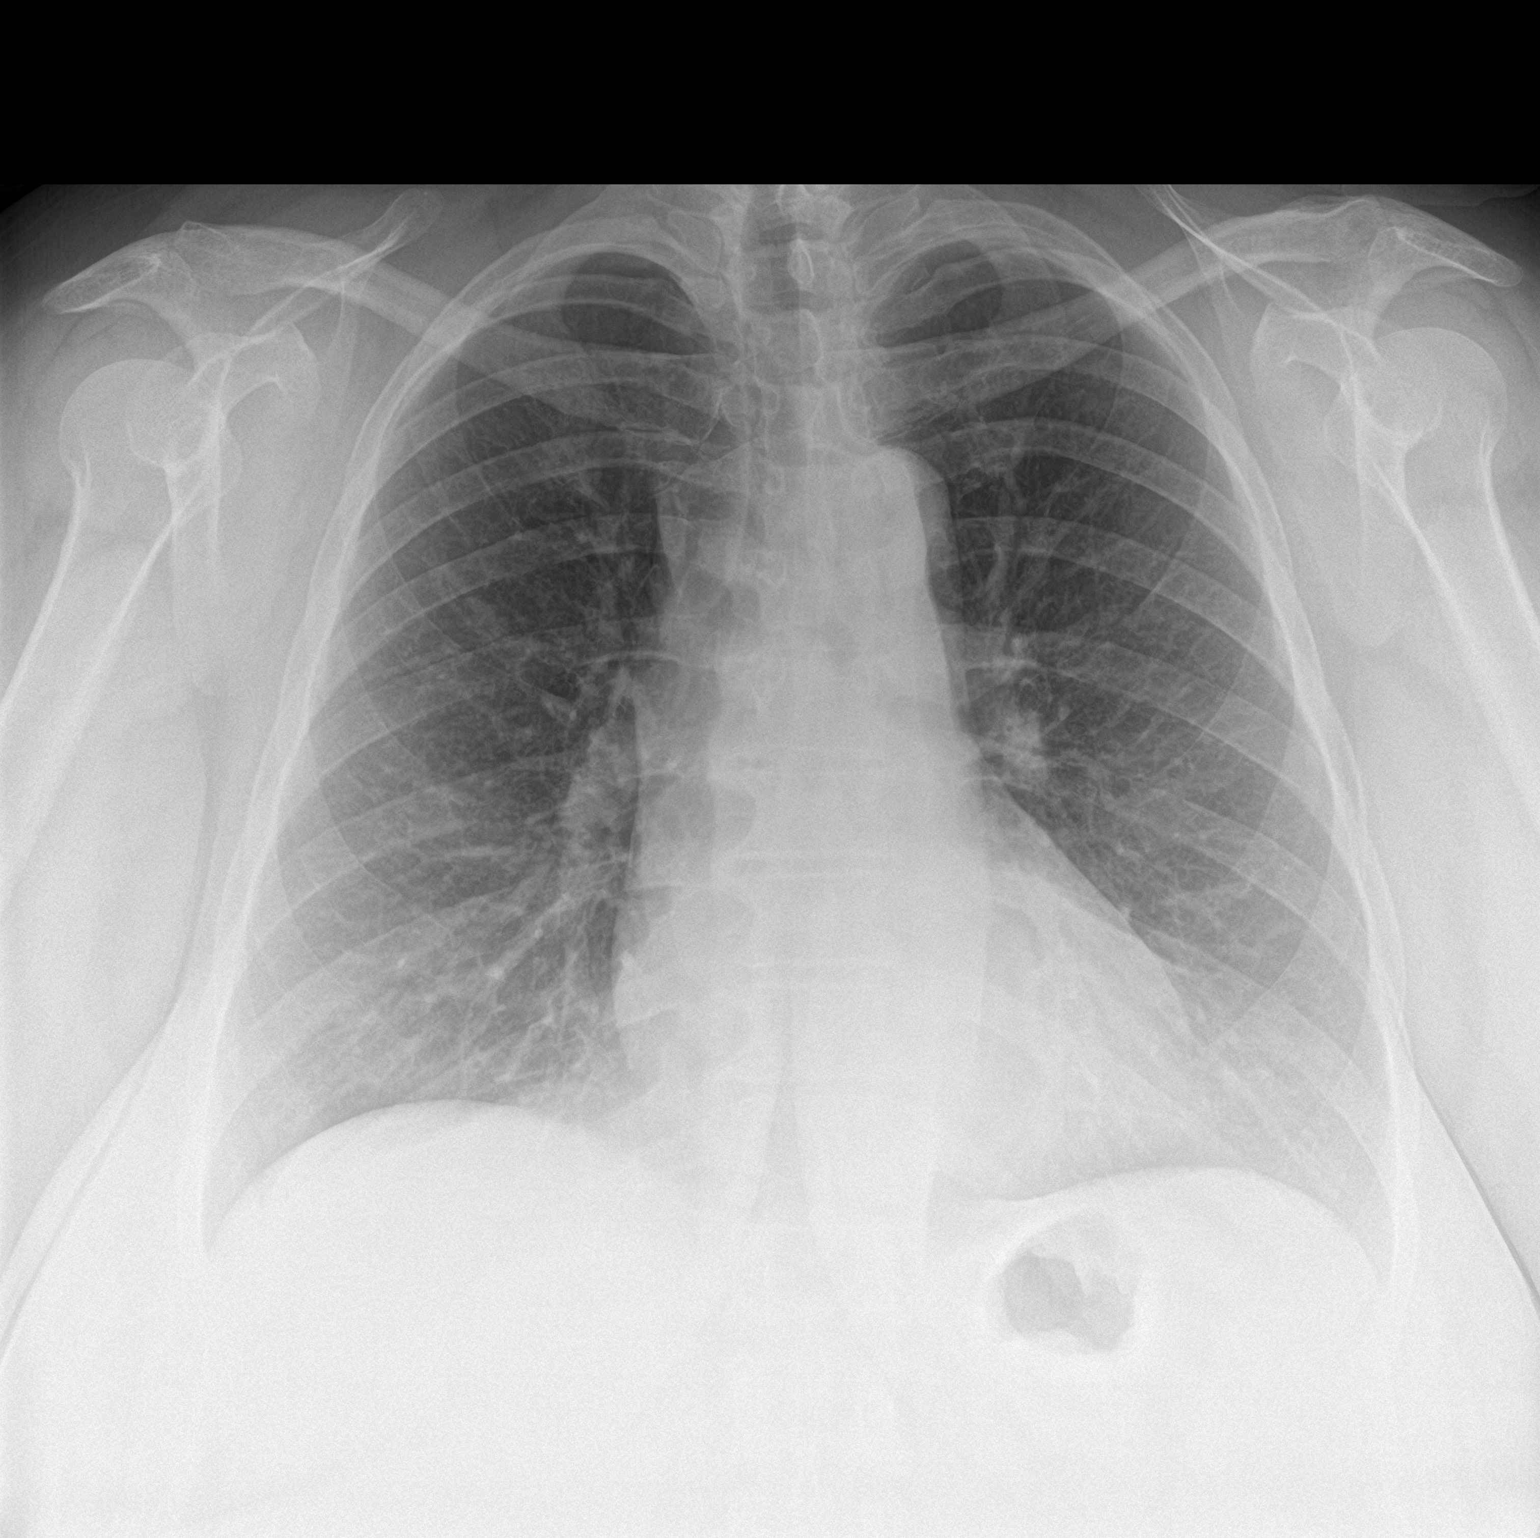

[chest lat]
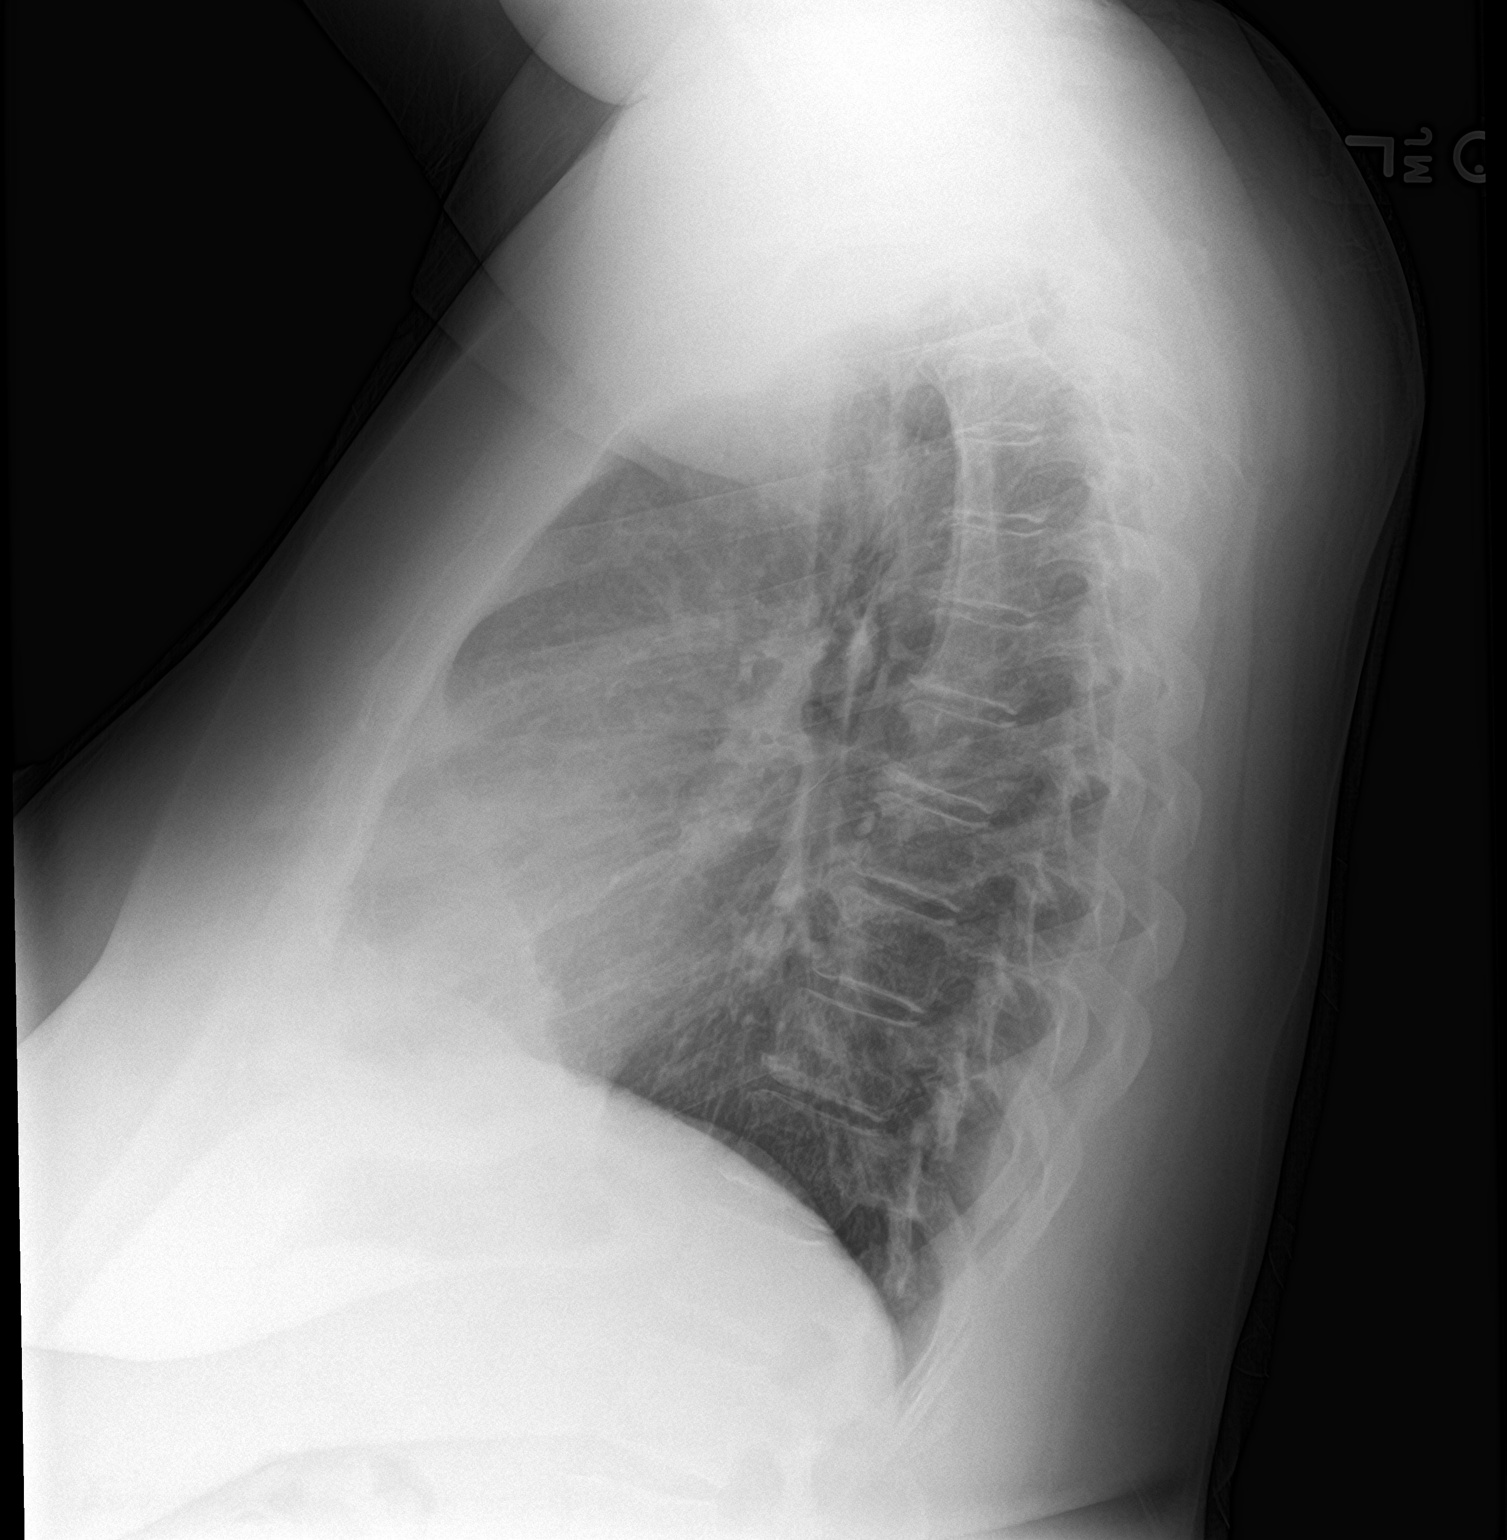

[2 of 2 positions shown; findings below may reference images not displayed]

FINDINGS: Mild bronchitic changes at the lower lobes. No consolidation or
effusion. Normal heart size. No pneumothorax.
IMPRESSION: Bronchitic changes at the lower lobes without focal pulmonary
infiltrate

## 2020-03-24 ENCOUNTER — Encounter: Payer: Self-pay | Admitting: Physician Assistant

## 2020-03-24 ENCOUNTER — Ambulatory Visit (INDEPENDENT_AMBULATORY_CARE_PROVIDER_SITE_OTHER): Payer: BC Managed Care – PPO | Admitting: Physician Assistant

## 2020-03-24 VITALS — BP 142/88 | HR 73 | Wt 290.7 lb

## 2020-03-24 DIAGNOSIS — Z1159 Encounter for screening for other viral diseases: Secondary | ICD-10-CM | POA: Diagnosis not present

## 2020-03-24 DIAGNOSIS — E118 Type 2 diabetes mellitus with unspecified complications: Secondary | ICD-10-CM | POA: Diagnosis not present

## 2020-03-24 DIAGNOSIS — I152 Hypertension secondary to endocrine disorders: Secondary | ICD-10-CM

## 2020-03-24 DIAGNOSIS — Z13220 Encounter for screening for lipoid disorders: Secondary | ICD-10-CM | POA: Diagnosis not present

## 2020-03-24 DIAGNOSIS — E1159 Type 2 diabetes mellitus with other circulatory complications: Secondary | ICD-10-CM

## 2020-03-24 LAB — POCT GLYCOSYLATED HEMOGLOBIN (HGB A1C): Hemoglobin A1C: 6.8 % — AB (ref 4.0–5.6)

## 2020-03-24 MED ORDER — XIGDUO XR 10-1000 MG PO TB24: 1.0000 | ORAL_TABLET | Freq: Every day | ORAL | 0 refills | Status: DC

## 2020-03-24 NOTE — Progress Notes (Signed)
Subjective:    Patient ID: Martha Adkins, female    DOB: 25-Sep-1965, 55 y.o.   MRN: [id number]  HPI  Patient is a 55 year old obese female with type 2 diabetes, hypertension who presents for 65-month follow-up.  Patient has been very compliant with her Merleen Nicely for the last 3 months.  She has tried to make some diet changes but been good and bad throughout the last 3 months.  She is checking her sugar at times and then ranging about 120-130.  She denies any open wounds or sores.  She denies any hypoglycemic events.  She denies any chest pain, palpitations, headache or vision changes.  She takes her blood pressure medication in the mornings.  She occasionally does check her blood pressure at home and ranging in the 130s to 140s over 80s to 90s.  Last A1c was .[name] Lab Results  Component Value Date   HGBA1C 7.1 (A) 11/25/2019   .[name] Active Ambulatory Problems    Diagnosis Date Noted  . Perimenopausal 08/30/2016  . Elevated blood pressure reading in office with diagnosis of hypertension 08/30/2016  . Hypertension goal BP (blood pressure) < 130/80 09/13/2016  . Eustachian tube dysfunction, right 09/13/2016  . At risk for obstructive sleep apnea 09/13/2016  . Hypertension associated with diabetes ([place]) 09/29/2016  . Class 3 severe obesity due to excess calories with serious comorbidity and body mass index (BMI) of 50.0 to 59.9 in adult ([place]) 03/08/2017  . History of melanoma excision 03/08/2017  . Controlled type 2 diabetes mellitus without complication, without long-term current use of insulin ([place]) 01/15/2018  . Cervicalgia 01/15/2018  . Cervical paraspinal muscle spasm 01/15/2018  . Nonscarring hair loss 07/18/2018  . Hyperlipidemia associated with type 2 diabetes mellitus ([place]) 07/18/2018   Resolved Ambulatory Problems    Diagnosis Date Noted  . Middle ear effusion, right 09/13/2016  . Need for hepatitis B vaccination 10/02/2018   Past Medical History:  Diagnosis Date  . Melanoma  ([place]) 1992  . Obesity   . Vitamin D deficiency      Review of Systems  All other systems reviewed and are negative.      Objective:   Physical Exam Vitals reviewed.  Constitutional:      Appearance: Normal appearance. She is obese.  Neck:     Vascular: No carotid bruit.  Cardiovascular:     Rate and Rhythm: Normal rate and regular rhythm.     Pulses: Normal pulses.     Heart sounds: Murmur heard.    Pulmonary:     Effort: Pulmonary effort is normal.  Neurological:     General: No focal deficit present.     Mental Status: She is alert and oriented to person, place, and time.  Psychiatric:        Mood and Affect: Mood normal.          .. Results for orders placed or performed in visit on 03/24/20  POCT glycosylated hemoglobin (Hb A1C)  Result Value Ref Range   Hemoglobin A1C 6.8 (A) 4.0 - 5.6 %   HbA1c POC (<> result, manual entry)     HbA1c, POC (prediabetic range)     HbA1c, POC (controlled diabetic range)      Assessment & Plan:  Marland KitchenMarland KitchenDiagnoses and all orders for this visit:  Type II diabetes mellitus with complication (HCC) -     Dapagliflozin-metFORMIN HCl ER (XIGDUO XR) 12-998 MG TB24; Take 1 tablet by mouth daily. -     TSH -  POCT glycosylated hemoglobin (Hb A1C)  Hypertension associated with diabetes ([place]) -     COMPLETE METABOLIC PANEL WITH GFR  Encounter for hepatitis C screening test for low risk patient -     Hepatitis C Antibody  Screening for lipid disorders -     Lipid Panel w/reflex Direct LDL  Morbid obesity (HCC)   A1C is under 7 stable today.  Continue xigduo.  Discussed adding GLP-1 for weight and sugar control. Pt declined.  On ARB BP not to goal in office. Reports better readings at home. Goal is under 130/90. She is only taking 1/2 tablet suggest increasing to 1 tablet if above goal.  Declines statin.LDL ordered.  Foot exam UTD.  2 covid vaccines done.  Declined flu/pneumonia.  Follow up in 32months.

## 2020-06-29 ENCOUNTER — Ambulatory Visit: Payer: BC Managed Care – PPO | Admitting: Physician Assistant

## 2020-07-05 ENCOUNTER — Telehealth: Payer: Self-pay | Admitting: Neurology

## 2020-07-05 DIAGNOSIS — E118 Type 2 diabetes mellitus with unspecified complications: Secondary | ICD-10-CM

## 2020-07-05 MED ORDER — XIGDUO XR 10-1000 MG PO TB24: 1.0000 | ORAL_TABLET | Freq: Every day | ORAL | 0 refills | Status: DC

## 2020-07-05 NOTE — Telephone Encounter (Signed)
Patient left vm stating she had to move her visit to May 18th and needs Xigduo refill to last til appt. Last visit was in January. One month sent to pharmacy.

## 2020-07-21 ENCOUNTER — Ambulatory Visit: Payer: BC Managed Care – PPO | Admitting: Physician Assistant

## 2020-07-21 ENCOUNTER — Other Ambulatory Visit: Payer: Self-pay

## 2020-07-21 VITALS — BP 142/79 | HR 71 | Ht 64.0 in | Wt 291.0 lb

## 2020-07-21 DIAGNOSIS — E118 Type 2 diabetes mellitus with unspecified complications: Secondary | ICD-10-CM | POA: Diagnosis not present

## 2020-07-21 DIAGNOSIS — E785 Hyperlipidemia, unspecified: Secondary | ICD-10-CM

## 2020-07-21 DIAGNOSIS — Z5329 Procedure and treatment not carried out because of patient's decision for other reasons: Secondary | ICD-10-CM

## 2020-07-21 DIAGNOSIS — Z6841 Body Mass Index (BMI) 40.0 and over, adult: Secondary | ICD-10-CM

## 2020-07-21 DIAGNOSIS — I152 Hypertension secondary to endocrine disorders: Secondary | ICD-10-CM

## 2020-07-21 DIAGNOSIS — E1159 Type 2 diabetes mellitus with other circulatory complications: Secondary | ICD-10-CM

## 2020-07-21 DIAGNOSIS — Z5320 Procedure and treatment not carried out because of patient's decision for unspecified reasons: Secondary | ICD-10-CM

## 2020-07-21 LAB — POCT GLYCOSYLATED HEMOGLOBIN (HGB A1C): Hemoglobin A1C: 6.8 % — AB (ref 4.0–5.6)

## 2020-07-21 MED ORDER — XIGDUO XR 10-1000 MG PO TB24
1.0000 | ORAL_TABLET | Freq: Every day | ORAL | 2 refills | Status: DC
Start: 2020-07-21 — End: 2020-11-23

## 2020-07-21 NOTE — Patient Instructions (Signed)
Diabetes Mellitus and Exercise Exercising regularly is important for overall health, especially for people who have diabetes mellitus. Exercising is not only about losing weight. It has many other health benefits, such as increasing muscle strength and bone density and reducing body fat and stress. This leads to improved fitness, flexibility, and endurance, all of which result in better overall health. What are the benefits of exercise if I have diabetes? Exercise has many benefits for people with diabetes. They include:  Helping to lower and control blood sugar (glucose).  Helping the body to respond better to the hormone insulin by improving insulin sensitivity.  Reducing how much insulin the body needs.  Lowering the risk for heart disease by: ? Lowering "bad" cholesterol and triglyceride levels. ? Increasing "good" cholesterol levels. ? Lowering blood pressure. ? Lowering blood glucose levels. What is my activity plan? Your health care provider or certified diabetes educator can help you make a plan for the type and frequency of exercise that works for you. This is called your activity plan. Be sure to:  Get at least 150 minutes of medium-intensity or high-intensity exercise each week. Exercises may include brisk walking, biking, or water aerobics.  Do stretching and strengthening exercises, such as yoga or weight lifting, at least 2 times a week.  Spread out your activity over at least 3 days of the week.  Get some form of physical activity each day. ? Do not go more than 2 days in a row without some kind of physical activity. ? Avoid being inactive for more than 90 minutes at a time. Take frequent breaks to walk or stretch.  Choose exercises or activities that you enjoy. Set realistic goals.  Start slowly and gradually increase your exercise intensity over time.   How do I manage my diabetes during exercise? Monitor your blood glucose  Check your blood glucose before and  after exercising. If your blood glucose is: ? 240 mg/dL (13.3 mmol/L) or higher before you exercise, check your urine for ketones. These are chemicals created by the liver. If you have ketones in your urine, do not exercise until your blood glucose returns to normal. ? 100 mg/dL (5.6 mmol/L) or lower, eat a snack containing 15-20 grams of carbohydrate. Check your blood glucose 15 minutes after the snack to make sure that your glucose level is above 100 mg/dL (5.6 mmol/L) before you start your exercise.  Know the symptoms of low blood glucose (hypoglycemia) and how to treat it. Your risk for hypoglycemia increases during and after exercise. Follow these tips and your health care provider's instructions  Keep a carbohydrate snack that is fast-acting for use before, during, and after exercise to help prevent or treat hypoglycemia.  Avoid injecting insulin into areas of the body that are going to be exercised. For example, avoid injecting insulin into: ? Your arms, when you are about to play tennis. ? Your legs, when you are about to go jogging.  Keep records of your exercise habits. Doing this can help you and your health care provider adjust your diabetes management plan as needed. Write down: ? Food that you eat before and after you exercise. ? Blood glucose levels before and after you exercise. ? The type and amount of exercise you have done.  Work with your health care provider when you start a new exercise or activity. He or she may need to: ? Make sure that the activity is safe for you. ? Adjust your insulin, other medicines, and food that   you eat.  Drink plenty of water while you exercise. This prevents loss of water (dehydration) and problems caused by a lot of heat in the body (heat stroke).   Where to find more information  American Diabetes Association: www.diabetes.org Summary  Exercising regularly is important for overall health, especially for people who have diabetes  mellitus.  Exercising has many health benefits. It increases muscle strength and bone density and reduces body fat and stress. It also lowers and controls blood glucose.  Your health care provider or certified diabetes educator can help you make an activity plan for the type and frequency of exercise that works for you.  Work with your health care provider to make sure any new activity is safe for you. Also work with your health care provider to adjust your insulin, other medicines, and the food you eat. This information is not intended to replace advice given to you by your health care provider. Make sure you discuss any questions you have with your health care provider. Document Revised: 11/18/2018 Document Reviewed: 11/18/2018 Elsevier Patient Education  2021 Elsevier Inc.  

## 2020-07-21 NOTE — Progress Notes (Signed)
Subjective:    Patient ID: Martha Adkins, female    DOB: 17-Aug-1965, 55 y.o.   MRN: [id number]  HPI  Patient is a 55 year old obese female with type 2 diabetes, hypertension, hyperlipidemia who presents to the clinic for medication refill and 62-month follow-up.  Patient is actively trying to watch and manage her diet.  She is trying to be more active.  She got 8000 steps yesterday.  She is not checking her sugars.  She denies any hypoglycemic events.  She does admit that Merleen Nicely does make her urinate more but not problematic.  She denies any open sores or wounds.  She denies any chest pain, palpitations, headaches or vision changes.   .. Active Ambulatory Problems    Diagnosis Date Noted  . Perimenopausal 08/30/2016  . Elevated blood pressure reading in office with diagnosis of hypertension 08/30/2016  . Hypertension goal BP (blood pressure) < 130/80 09/13/2016  . Eustachian tube dysfunction, right 09/13/2016  . At risk for obstructive sleep apnea 09/13/2016  . Hypertension associated with diabetes ([place]) 09/29/2016  . Class 3 severe obesity due to excess calories with serious comorbidity and body mass index (BMI) of 45.0 to 49.9 in adult ([place]) 03/08/2017  . History of melanoma excision 03/08/2017  . Type II diabetes mellitus with complication ([place]) 40/98/1191  . Cervicalgia 01/15/2018  . Cervical paraspinal muscle spasm 01/15/2018  . Nonscarring hair loss 07/18/2018  . Hyperlipidemia associated with type 2 diabetes mellitus ([place]) 07/18/2018  . Dyslipidemia, goal LDL below 70 07/23/2020  . Statin medication declined by patient 07/23/2020   Resolved Ambulatory Problems    Diagnosis Date Noted  . Middle ear effusion, right 09/13/2016  . Need for hepatitis B vaccination 10/02/2018   Past Medical History:  Diagnosis Date  . Melanoma ([place]) 1992  . Obesity   . Vitamin D deficiency     Review of Systems  All other systems reviewed and are negative.      Objective:   Physical  Exam Vitals reviewed.  Constitutional:      Appearance: Normal appearance. She is obese.  HENT:     Head: Normocephalic.  Neck:     Vascular: No carotid bruit.  Cardiovascular:     Rate and Rhythm: Normal rate and regular rhythm.     Pulses: Normal pulses.     Heart sounds: Normal heart sounds.  Pulmonary:     Effort: Pulmonary effort is normal.     Breath sounds: Normal breath sounds.  Musculoskeletal:     Right lower leg: No edema.     Left lower leg: No edema.  Neurological:     General: No focal deficit present.     Mental Status: She is alert and oriented to person, place, and time.  Psychiatric:        Mood and Affect: Mood normal.          .. Results for orders placed or performed in visit on 07/21/20  POCT glycosylated hemoglobin (Hb A1C)  Result Value Ref Range   Hemoglobin A1C 6.8 (A) 4.0 - 5.6 %   HbA1c POC (<> result, manual entry)     HbA1c, POC (prediabetic range)     HbA1c, POC (controlled diabetic range)      Assessment & Plan:  Marland KitchenMarland KitchenTamyah was seen today for diabetes.  Diagnoses and all orders for this visit:  Type II diabetes mellitus with complication ([place]) -     POCT glycosylated hemoglobin (Hb A1C) -     Dapagliflozin-metFORMIN  HCl ER (XIGDUO XR) 12-998 MG TB24; Take 1 tablet by mouth daily.  Hypertension associated with diabetes ([place])  Dyslipidemia, goal LDL below 70  Statin medication declined by patient  Class 3 severe obesity due to excess calories with serious comorbidity and body mass index (BMI) of 45.0 to 49.9 in adult (HCC)   A1C to goal under 7.  Continue on xigduo.  Continue with weight loss.  On ARB. BP not to goal. Taking 1/2 tablet suggest increasing it to 1 full tablet.  Declined statin. Discussed risk of this decision.  Foot exam UTD.  Eye exam UTD.  Declined covid and pneumonia.  Flu UTD.  Follow up in 3 months

## 2020-07-23 ENCOUNTER — Encounter: Payer: Self-pay | Admitting: Physician Assistant

## 2020-07-23 DIAGNOSIS — Z5329 Procedure and treatment not carried out because of patient's decision for other reasons: Secondary | ICD-10-CM | POA: Insufficient documentation

## 2020-07-23 DIAGNOSIS — E785 Hyperlipidemia, unspecified: Secondary | ICD-10-CM | POA: Insufficient documentation

## 2020-07-23 DIAGNOSIS — Z5320 Procedure and treatment not carried out because of patient's decision for unspecified reasons: Secondary | ICD-10-CM | POA: Insufficient documentation

## 2020-10-25 ENCOUNTER — Ambulatory Visit: Payer: BC Managed Care – PPO | Admitting: Physician Assistant

## 2020-11-12 ENCOUNTER — Other Ambulatory Visit: Payer: Self-pay | Admitting: Neurology

## 2020-11-12 DIAGNOSIS — E1159 Type 2 diabetes mellitus with other circulatory complications: Secondary | ICD-10-CM

## 2020-11-12 MED ORDER — CANDESARTAN CILEXETIL-HCTZ 32-25 MG PO TABS: 1.0000 | ORAL_TABLET | Freq: Every day | ORAL | 1 refills | Status: DC

## 2020-11-23 ENCOUNTER — Ambulatory Visit: Payer: BC Managed Care – PPO | Admitting: Physician Assistant

## 2020-11-23 VITALS — BP 166/73 | HR 72 | Ht 64.0 in | Wt 298.0 lb

## 2020-11-23 DIAGNOSIS — E118 Type 2 diabetes mellitus with unspecified complications: Secondary | ICD-10-CM | POA: Diagnosis not present

## 2020-11-23 DIAGNOSIS — Z5320 Procedure and treatment not carried out because of patient's decision for unspecified reasons: Secondary | ICD-10-CM

## 2020-11-23 DIAGNOSIS — Z5329 Procedure and treatment not carried out because of patient's decision for other reasons: Secondary | ICD-10-CM

## 2020-11-23 DIAGNOSIS — Z1159 Encounter for screening for other viral diseases: Secondary | ICD-10-CM

## 2020-11-23 DIAGNOSIS — I152 Hypertension secondary to endocrine disorders: Secondary | ICD-10-CM

## 2020-11-23 DIAGNOSIS — E785 Hyperlipidemia, unspecified: Secondary | ICD-10-CM | POA: Diagnosis not present

## 2020-11-23 DIAGNOSIS — Z6841 Body Mass Index (BMI) 40.0 and over, adult: Secondary | ICD-10-CM

## 2020-11-23 DIAGNOSIS — E1159 Type 2 diabetes mellitus with other circulatory complications: Secondary | ICD-10-CM | POA: Diagnosis not present

## 2020-11-23 LAB — POCT GLYCOSYLATED HEMOGLOBIN (HGB A1C): Hemoglobin A1C: 6.7 % — AB (ref 4.0–5.6)

## 2020-11-23 MED ORDER — XIGDUO XR 10-1000 MG PO TB24
1.0000 | ORAL_TABLET | Freq: Every day | ORAL | 0 refills | Status: DC
Start: 2020-11-23 — End: 2021-03-04

## 2020-11-23 NOTE — Progress Notes (Addendum)
Subjective:    Patient ID: Martha Adkins, female    DOB: 25-Jan-1966, 55 y.o.   MRN: [id number]  HPI Patient is a 55 year old obese female with type 2 diabetes, hypertension, hyperlipidemia who presents to the clinic for 12-month follow-up.  Patient is not checking her sugars regularly.  She is only taking Xigduo.  She denies any hypoglycemic symptoms.  She denies any open sores or wounds.  She denies any chest pain, palpitations, headaches or vision changes.  She is also taking her blood pressure medicine daily to keep her BP under control.  When she does check at home it is 120s over 80s.  She has been out of it for the past few days.  She denies regular exercise.  She does try to keep a low sugar diet. .. Active Ambulatory Problems    Diagnosis Date Noted   Perimenopausal 08/30/2016   Elevated blood pressure reading in office with diagnosis of hypertension 08/30/2016   Hypertension goal BP (blood pressure) < 130/80 09/13/2016   Eustachian tube dysfunction, right 09/13/2016   At risk for obstructive sleep apnea 09/13/2016   Hypertension associated with diabetes ([place]) 09/29/2016   Class 3 severe obesity due to excess calories with serious comorbidity and body mass index (BMI) of 45.0 to 49.9 in adult [facility]) 03/08/2017   History of melanoma excision 03/08/2017   Type II diabetes mellitus with complication ([place]) 03/54/6568   Cervicalgia 01/15/2018   Cervical paraspinal muscle spasm 01/15/2018   Nonscarring hair loss 07/18/2018   Hyperlipidemia associated with type 2 diabetes mellitus ([place]) 07/18/2018   Dyslipidemia, goal LDL below 70 07/23/2020   Statin medication declined by patient 07/23/2020   Resolved Ambulatory Problems    Diagnosis Date Noted   Middle ear effusion, right 09/13/2016   Need for hepatitis B vaccination 10/02/2018   Past Medical History:  Diagnosis Date   Melanoma ([place]) 1992   Obesity    Vitamin D deficiency      Review of Systems See HPI.     Objective:    Physical Exam Vitals reviewed.  Constitutional:      Appearance: Normal appearance. She is obese.  HENT:     Head: Normocephalic.  Neck:     Vascular: No carotid bruit.  Cardiovascular:     Rate and Rhythm: Normal rate and regular rhythm.     Pulses: Normal pulses.     Heart sounds: Normal heart sounds. No murmur heard. Pulmonary:     Effort: Pulmonary effort is normal.     Breath sounds: Normal breath sounds.  Musculoskeletal:     Right lower leg: No edema.     Left lower leg: No edema.  Neurological:     General: No focal deficit present.     Mental Status: She is alert and oriented to person, place, and time.  Psychiatric:        Mood and Affect: Mood normal.    .. Results for orders placed or performed in visit on 11/23/20  POCT glycosylated hemoglobin (Hb A1C)  Result Value Ref Range   Hemoglobin A1C 6.7 (A) 4.0 - 5.6 %   HbA1c POC (<> result, manual entry)     HbA1c, POC (prediabetic range)     HbA1c, POC (controlled diabetic range)           Assessment & Plan:  Marland KitchenMarland KitchenCorene was seen today for diabetes and hypertension.  Diagnoses and all orders for this visit:  Type II diabetes mellitus with complication ([place]) -  POCT glycosylated hemoglobin (Hb A1C) -     Dapagliflozin-metFORMIN HCl ER (XIGDUO XR) 12-998 MG TB24; Take 1 tablet by mouth daily. -     COMPLETE METABOLIC PANEL WITH GFR -     Comprehensive metabolic panel  Hypertension associated with diabetes (HCC)  Dyslipidemia, goal LDL below 70 -     Lipid Panel w/reflex Direct LDL -     Lipid Panel w/o Chol/HDL Ratio  Class 3 severe obesity due to excess calories with serious comorbidity and body mass index (BMI) of 50.0 to 59.9 in adult (HCC) -     Cancel: TSH -     TSH  Encounter for hepatitis C screening test for low risk patient -     Cancel: Hepatitis C Antibody -     Hepatitis C Antibody  Statin medication declined by patient  A1C to goal at 6.7.  Continue xigduo.  Continue to work  on weight loss. Consider GLP-1. Pt declined today.  On ARB. BP not controlled today but did not have medication to take. Reports BP at home in 120s over 80s.  Declines statin. Pt aware of risk. Lipid ordered.  Eye exam UTD.  Needs foot exam.  Covid vaccine x2.  Declines flu, pneumonia, shingles vaccine.   [name].Discussed low carb diet with 1500 calories and 80g of protein.  Exercising at least 150 minutes a week.  My Fitness Pal could be a Microbiologist.  Discussed medications. Pt declined.

## 2020-11-24 ENCOUNTER — Encounter: Payer: Self-pay | Admitting: Physician Assistant

## 2020-12-01 LAB — LIPID PANEL W/O CHOL/HDL RATIO
Cholesterol, Total: 249 mg/dL — ABNORMAL HIGH (ref 100–199)
HDL: 64 mg/dL (ref >39)
LDL Chol Calc (NIH): 161 mg/dL — ABNORMAL HIGH (ref 0–99)
Triglycerides: 135 mg/dL (ref 0–149)
VLDL Cholesterol Cal: 24 mg/dL (ref 5–40)

## 2020-12-01 LAB — COMPREHENSIVE METABOLIC PANEL
ALT: 27 IU/L (ref 0–32)
AST: 24 IU/L (ref 0–40)
Albumin/Globulin Ratio: 2.2 (ref 1.2–2.2)
Albumin: 4.9 g/dL (ref 3.8–4.9)
Alkaline Phosphatase: 108 IU/L (ref 44–121)
BUN/Creatinine Ratio: 22 (ref 9–23)
BUN: 15 mg/dL (ref 6–24)
Bilirubin Total: 0.4 mg/dL (ref 0.0–1.2)
CO2: 26 mmol/L (ref 20–29)
Calcium: 9.7 mg/dL (ref 8.7–10.2)
Chloride: 96 mmol/L (ref 96–106)
Creatinine, Ser: 0.68 mg/dL (ref 0.57–1.00)
Globulin, Total: 2.2 g/dL (ref 1.5–4.5)
Glucose: 118 mg/dL — ABNORMAL HIGH (ref 70–99)
Potassium: 3.5 mmol/L (ref 3.5–5.2)
Sodium: 138 mmol/L (ref 134–144)
Total Protein: 7.1 g/dL (ref 6.0–8.5)
eGFR: 103 mL/min/{1.73_m2} (ref >59)

## 2020-12-01 LAB — HEPATITIS C ANTIBODY: Hep C Virus Ab: <0.1 s/co ratio (ref 0.0–0.9)

## 2020-12-01 LAB — TSH: TSH: 4.09 u[IU]/mL (ref 0.450–4.500)

## 2020-12-01 NOTE — Progress Notes (Signed)
Lindia,   LDL is 161 with goal being under 70. I strongly recommend statin to lower LDL.  TSH normal range but upper level normal. Will continue to watch.

## 2020-12-22 LAB — HM MAMMOGRAPHY

## 2021-02-25 ENCOUNTER — Ambulatory Visit: Payer: BC Managed Care – PPO | Admitting: Physician Assistant

## 2021-03-04 ENCOUNTER — Other Ambulatory Visit: Payer: Self-pay | Admitting: Physician Assistant

## 2021-03-04 DIAGNOSIS — E118 Type 2 diabetes mellitus with unspecified complications: Secondary | ICD-10-CM

## 2021-04-06 ENCOUNTER — Other Ambulatory Visit: Payer: Self-pay

## 2021-04-06 ENCOUNTER — Encounter: Payer: Self-pay | Admitting: Physician Assistant

## 2021-04-06 ENCOUNTER — Ambulatory Visit: Payer: BC Managed Care – PPO | Admitting: Physician Assistant

## 2021-04-06 VITALS — BP 147/82 | HR 78 | Ht 64.0 in | Wt 290.0 lb

## 2021-04-06 DIAGNOSIS — I10 Essential (primary) hypertension: Secondary | ICD-10-CM

## 2021-04-06 DIAGNOSIS — I152 Hypertension secondary to endocrine disorders: Secondary | ICD-10-CM

## 2021-04-06 DIAGNOSIS — Z5320 Procedure and treatment not carried out because of patient's decision for unspecified reasons: Secondary | ICD-10-CM

## 2021-04-06 DIAGNOSIS — E1165 Type 2 diabetes mellitus with hyperglycemia: Secondary | ICD-10-CM

## 2021-04-06 DIAGNOSIS — E785 Hyperlipidemia, unspecified: Secondary | ICD-10-CM

## 2021-04-06 DIAGNOSIS — E1159 Type 2 diabetes mellitus with other circulatory complications: Secondary | ICD-10-CM

## 2021-04-06 MED ORDER — CANDESARTAN CILEXETIL-HCTZ 32-25 MG PO TABS: 1.0000 | ORAL_TABLET | Freq: Every day | ORAL | 1 refills | Status: DC

## 2021-04-06 MED ORDER — XIGDUO XR 10-1000 MG PO TB24: 1.0000 | ORAL_TABLET | Freq: Every day | ORAL | 1 refills | Status: DC

## 2021-04-06 NOTE — Progress Notes (Signed)
Subjective:    Patient ID: Martha Adkins, female    DOB: 20-Jul-1965, 56 y.o.   MRN: [id number]  HPI Pt is a 56 yo obese female with T2DM, HLD, HTN who presents to the clinic for 3 month follow up.   She has not been keeping to a healthy diet. She has not been exercising. She has not been checking sugars regularly. No CP, palpitations, headaches, vision changes. No hypoglycemic events. No open wounds. She has been compliant with medications.   . Active Ambulatory Problems    Diagnosis Date Noted   Perimenopausal 08/30/2016   Elevated blood pressure reading in office with diagnosis of hypertension 08/30/2016   Hypertension goal BP (blood pressure) < 130/80 09/13/2016   Eustachian tube dysfunction, right 09/13/2016   At risk for obstructive sleep apnea 09/13/2016   Hypertension associated with diabetes ([place]) 09/29/2016   Class 3 severe obesity due to excess calories with serious comorbidity and body mass index (BMI) of 45.0 to 49.9 in adult [facility]) 03/08/2017   History of melanoma excision 03/08/2017   Type II diabetes mellitus with complication ([place]) 29/79/8921   Cervicalgia 01/15/2018   Cervical paraspinal muscle spasm 01/15/2018   Nonscarring hair loss 07/18/2018   Hyperlipidemia associated with type 2 diabetes mellitus ([place]) 07/18/2018   Dyslipidemia, goal LDL below 70 07/23/2020   Statin medication declined by patient 07/23/2020   Resolved Ambulatory Problems    Diagnosis Date Noted   Middle ear effusion, right 09/13/2016   Need for hepatitis B vaccination 10/02/2018   Past Medical History:  Diagnosis Date   Melanoma ([place]) 1992   Obesity    Vitamin D deficiency       Review of Systems  All other systems reviewed and are negative.     Objective:   Physical Exam Vitals reviewed.  Constitutional:      Appearance: Normal appearance. She is obese.  HENT:     Head: Normocephalic.  Neck:     Vascular: No carotid bruit.  Cardiovascular:     Rate and Rhythm: Normal  rate and regular rhythm.     Pulses: Normal pulses.  Pulmonary:     Effort: Pulmonary effort is normal.     Breath sounds: Normal breath sounds.  Musculoskeletal:     Right lower leg: No edema.     Left lower leg: No edema.  Neurological:     General: No focal deficit present.     Mental Status: She is alert and oriented to person, place, and time.  Psychiatric:        Mood and Affect: Mood normal.    .. Results for orders placed or performed in visit on 04/06/21  POCT HgB A1C  Result Value Ref Range   Hemoglobin A1C 7.4 (A) 4.0 - 5.6 %   HbA1c POC (<> result, manual entry)     HbA1c, POC (prediabetic range)     HbA1c, POC (controlled diabetic range)          Assessment & Plan:  Marland KitchenMarland KitchenCharlii was seen today for follow-up.  Diagnoses and all orders for this visit:  Uncontrolled type 2 diabetes mellitus with hyperglycemia ([place]) -     POCT HgB A1C -     Dapagliflozin-metFORMIN HCl ER (XIGDUO XR) 12-998 MG TB24; Take 1 tablet by mouth daily.  Hypertension associated with diabetes ([place]) -     Candesartan Cilexetil-HCTZ 32-25 MG TABS; Take 1 tablet by mouth daily.  Hypertension goal BP (blood pressure) < 130/80  Dyslipidemia, goal  LDL below 70  Statin medication declined by patient   A1C not to goal.  Pt does not want to add medication.  Discussed GLP-1 injections. Continue Xigduo for now. Discussed diet and exercise BP not to goal. Pt will start checking at home. On ArB.  Pt declined statin.  Needs eye exam.  Covid vaccine x2, declines booster.  Declines flu/pnuemonia/shingles vaccine.  Follow up in 3 months.

## 2021-04-08 ENCOUNTER — Encounter: Payer: Self-pay | Admitting: Physician Assistant

## 2021-04-08 LAB — POCT GLYCOSYLATED HEMOGLOBIN (HGB A1C): Hemoglobin A1C: 7.4 % — AB (ref 4.0–5.6)

## 2021-07-04 ENCOUNTER — Ambulatory Visit: Payer: BC Managed Care – PPO | Admitting: Physician Assistant

## 2021-09-12 ENCOUNTER — Encounter: Payer: Self-pay | Admitting: Physician Assistant

## 2021-09-12 ENCOUNTER — Ambulatory Visit: Payer: BC Managed Care – PPO | Admitting: Physician Assistant

## 2021-09-12 VITALS — BP 120/81 | HR 68 | Ht 64.0 in | Wt 288.0 lb

## 2021-09-12 DIAGNOSIS — E785 Hyperlipidemia, unspecified: Secondary | ICD-10-CM

## 2021-09-12 DIAGNOSIS — Z5320 Procedure and treatment not carried out because of patient's decision for unspecified reasons: Secondary | ICD-10-CM

## 2021-09-12 DIAGNOSIS — I10 Essential (primary) hypertension: Secondary | ICD-10-CM | POA: Diagnosis not present

## 2021-09-12 DIAGNOSIS — E1159 Type 2 diabetes mellitus with other circulatory complications: Secondary | ICD-10-CM | POA: Diagnosis not present

## 2021-09-12 DIAGNOSIS — I152 Hypertension secondary to endocrine disorders: Secondary | ICD-10-CM

## 2021-09-12 DIAGNOSIS — E1165 Type 2 diabetes mellitus with hyperglycemia: Secondary | ICD-10-CM | POA: Diagnosis not present

## 2021-09-12 LAB — POCT GLYCOSYLATED HEMOGLOBIN (HGB A1C): Hemoglobin A1C: 7.4 % — AB (ref 4.0–5.6)

## 2021-09-12 LAB — POCT UA - MICROALBUMIN
Albumin/Creatinine Ratio, Urine, POC: <30
Creatinine, POC: 50 mg/dL
Microalbumin Ur, POC: 10 mg/L

## 2021-09-12 NOTE — Progress Notes (Signed)
Established Patient Office Visit  Subjective   Patient ID: Martha Adkins, female    DOB: Feb 20, 1966  Age: 56 y.o. MRN: [id number]  Chief Complaint  Patient presents with   Diabetes    HPI Pt is a 56 yo obese female with T2DM, HTN, HLD who presents to the clinic for follow up. She admits she has not been doing well with diet or exercise. She has been working more and not had time. She is taking her medications. She denies any CP, palpitations, headaches. No hypoglycemic symptoms. No open sores or wounds. In the last month she has made more effort to make better food choices and has seen her BS numbers go down to 150s in morning.     Active Ambulatory Problems    Diagnosis Date Noted   Perimenopausal 08/30/2016   Elevated blood pressure reading in office with diagnosis of hypertension 08/30/2016   Hypertension goal BP (blood pressure) < 130/80 09/13/2016   Eustachian tube dysfunction, right 09/13/2016   At risk for obstructive sleep apnea 09/13/2016   Hypertension associated with diabetes ([place]) 09/29/2016   Class 3 severe obesity due to excess calories with serious comorbidity and body mass index (BMI) of 45.0 to 49.9 in adult [facility]) 03/08/2017   History of melanoma excision 03/08/2017   Type II diabetes mellitus with complication ([place]) 73/22/0254   Cervicalgia 01/15/2018   Cervical paraspinal muscle spasm 01/15/2018   Nonscarring hair loss 07/18/2018   Hyperlipidemia associated with type 2 diabetes mellitus ([place]) 07/18/2018   Dyslipidemia, goal LDL below 70 07/23/2020   Statin medication declined by patient 07/23/2020   Uncontrolled type 2 diabetes mellitus with hyperglycemia ([place]) 09/14/2021   Resolved Ambulatory Problems    Diagnosis Date Noted   Middle ear effusion, right 09/13/2016   Need for hepatitis B vaccination 10/02/2018   Past Medical History:  Diagnosis Date   Melanoma ([place]) 1992   Obesity    Vitamin D deficiency       Review of Systems  All other systems  reviewed and are negative.     Objective:     BP 120/81   Pulse 68   Ht '5\' 4"'$  (1.626 m)   Wt 288 lb (130.6 kg)   SpO2 97%   BMI 49.44 kg/m  BP Readings from Last 3 Encounters:  09/12/21 120/81  04/06/21 (!) 147/82  11/23/20 (!) 166/73   Wt Readings from Last 3 Encounters:  09/12/21 288 lb (130.6 kg)  04/06/21 290 lb (131.5 kg)  11/23/20 298 lb (135.2 kg)    .[name] Results for orders placed or performed in visit on 09/12/21  POCT UA - Microalbumin  Result Value Ref Range   Microalbumin Ur, POC 10 mg/L   Creatinine, POC 50 mg/dL   Albumin/Creatinine Ratio, Urine, POC <30   POCT glycosylated hemoglobin (Hb A1C)  Result Value Ref Range   Hemoglobin A1C 7.4 (A) 4.0 - 5.6 %   HbA1c POC (<> result, manual entry)     HbA1c, POC (prediabetic range)     HbA1c, POC (controlled diabetic range)       Physical Exam Constitutional:      Appearance: Normal appearance. She is obese.  HENT:     Head: Normocephalic.  Neck:     Vascular: No carotid bruit.  Cardiovascular:     Rate and Rhythm: Normal rate and regular rhythm.     Pulses: Normal pulses.     Heart sounds: Normal heart sounds.  Pulmonary:  Effort: Pulmonary effort is normal.     Breath sounds: Normal breath sounds.  Musculoskeletal:     Right lower leg: No edema.     Left lower leg: No edema.  Lymphadenopathy:     Cervical: No cervical adenopathy.  Neurological:     General: No focal deficit present.     Mental Status: She is alert and oriented to person, place, and time.  Psychiatric:        Mood and Affect: Mood normal.       Assessment & Plan:  Marland KitchenMarland KitchenJeneal was seen today for diabetes.  Diagnoses and all orders for this visit:  Uncontrolled type 2 diabetes mellitus with hyperglycemia ([place]) -     POCT UA - Microalbumin -     POCT glycosylated hemoglobin (Hb A1C) -     Ambulatory referral to Ophthalmology  Hypertension associated with diabetes ([place])  Hypertension goal BP (blood pressure) <  130/80  Dyslipidemia, goal LDL below 70  Statin medication declined by patient  Morbid obesity ([place])   A1C is not to goal  Pt declines adding any additional medications to regimen, she wants to do it on her own Strongly recommended adding ozempic BP to goal, on ARB. Pt declined statin and aware of risk Referral for eye exam placed UTD foot exam Covid vaccine x2 Declined pneumonia and flu vaccine  .Marland KitchenDiscussed low carb diet with 1500 calories and 80g of protein.  Exercising at least 150 minutes a week.  [employer] could be a Microbiologist.    Return in about 3 months (around 12/13/2021).    Iran Planas, PA-C

## 2021-09-12 NOTE — Patient Instructions (Signed)
Semaglutide Injection What is this medication? SEMAGLUTIDE (SEM a GLOO tide) treats type 2 diabetes. It works by increasing insulin levels in your body, which decreases your blood sugar (glucose). It also reduces the amount of sugar released into the blood and slows down your digestion. It can also be used to lower the risk of heart attack and stroke in people with type 2 diabetes. Changes to diet and exercise are often combined with this medication. This medicine may be used for other purposes; ask your health care provider or pharmacist if you have questions. COMMON BRAND NAME(S): OZEMPIC What should I tell my care team before I take this medication? They need to know if you have any of these conditions: Endocrine tumors (MEN 2) or if someone in your family had these tumors Eye disease, vision problems History of pancreatitis Kidney disease Stomach problems Thyroid cancer or if someone in your family had thyroid cancer An unusual or allergic reaction to semaglutide, other medications, foods, dyes, or preservatives Pregnant or trying to get pregnant Breast-feeding How should I use this medication? This medication is for injection under the skin of your upper leg (thigh), stomach area, or upper arm. It is given once every week (every 7 days). You will be taught how to prepare and give this medication. Use exactly as directed. Take your medication at regular intervals. Do not take it more often than directed. If you use this medication with insulin, you should inject this medication and the insulin separately. Do not mix them together. Do not give the injections right next to each other. Change (rotate) injection sites with each injection. It is important that you put your used needles and syringes in a special sharps container. Do not put them in a trash can. If you do not have a sharps container, call your pharmacist or care team to get one. A special MedGuide will be given to you by the  pharmacist with each prescription and refill. Be sure to read this information carefully each time. This medication comes with INSTRUCTIONS FOR USE. Ask your pharmacist for directions on how to use this medication. Read the information carefully. Talk to your pharmacist or care team if you have questions. Talk to your care team about the use of this medication in children. Special care may be needed. Overdosage: If you think you have taken too much of this medicine contact a poison control center or emergency room at once. NOTE: This medicine is only for you. Do not share this medicine with others. What if I miss a dose? If you miss a dose, take it as soon as you can within 5 days after the missed dose. Then take your next dose at your regular weekly time. If it has been longer than 5 days after the missed dose, do not take the missed dose. Take the next dose at your regular time. Do not take double or extra doses. If you have questions about a missed dose, contact your care team for advice. What may interact with this medication? Other medications for diabetes Many medications may cause changes in blood sugar, these include: Alcohol containing beverages Antiviral medications for HIV or AIDS Aspirin and aspirin-like medications Certain medications for blood pressure, heart disease, irregular heart beat Chromium Diuretics Female hormones, such as estrogens or progestins, birth control pills Fenofibrate Gemfibrozil Isoniazid Lanreotide Female hormones or anabolic steroids MAOIs like Carbex, Eldepryl, Marplan, Nardil, and Parnate Medications for weight loss Medications for allergies, asthma, cold, or cough Medications for depression,   anxiety, or psychotic disturbances Niacin Nicotine NSAIDs, medications for pain and inflammation, like ibuprofen or naproxen Octreotide Pasireotide Pentamidine Phenytoin Probenecid Quinolone antibiotics such as ciprofloxacin, levofloxacin, ofloxacin Some  herbal dietary supplements Steroid medications such as prednisone or cortisone Sulfamethoxazole; trimethoprim Thyroid hormones Some medications can hide the warning symptoms of low blood sugar (hypoglycemia). You may need to monitor your blood sugar more closely if you are taking one of these medications. These include: Beta-blockers, often used for high blood pressure or heart problems (examples include atenolol, metoprolol, propranolol) Clonidine Guanethidine Reserpine This list may not describe all possible interactions. Give your health care provider a list of all the medicines, herbs, non-prescription drugs, or dietary supplements you use. Also tell them if you smoke, drink alcohol, or use illegal drugs. Some items may interact with your medicine. What should I watch for while using this medication? Visit your care team for regular checks on your progress. Drink plenty of fluids while taking this medication. Check with your care team if you get an attack of severe diarrhea, nausea, and vomiting. The loss of too much body fluid can make it dangerous for you to take this medication. A test called the HbA1C (A1C) will be monitored. This is a simple blood test. It measures your blood sugar control over the last 2 to 3 months. You will receive this test every 3 to 6 months. Learn how to check your blood sugar. Learn the symptoms of low and high blood sugar and how to manage them. Always carry a quick-source of sugar with you in case you have symptoms of low blood sugar. Examples include hard sugar candy or glucose tablets. Make sure others know that you can choke if you eat or drink when you develop serious symptoms of low blood sugar, such as seizures or unconsciousness. They must get medical help at once. Tell your care team if you have high blood sugar. You might need to change the dose of your medication. If you are sick or exercising more than usual, you might need to change the dose of your  medication. Do not skip meals. Ask your care team if you should avoid alcohol. Many nonprescription cough and cold products contain sugar or alcohol. These can affect blood sugar. Pens should never be shared. Even if the needle is changed, sharing may result in passing of viruses like hepatitis or HIV. Wear a medical ID bracelet or chain, and carry a card that describes your disease and details of your medication and dosage times. Do not become pregnant while taking this medication. Women should inform their care team if they wish to become pregnant or think they might be pregnant. There is a potential for serious side effects to an unborn child. Talk to your care team for more information. What side effects may I notice from receiving this medication? Side effects that you should report to your care team as soon as possible: Allergic reactions--skin rash, itching, hives, swelling of the face, lips, tongue, or throat Change in vision Dehydration--increased thirst, dry mouth, feeling faint or lightheaded, headache, dark yellow or brown urine Gallbladder problems--severe stomach pain, nausea, vomiting, fever Heart palpitations--rapid, pounding, or irregular heartbeat Kidney injury--decrease in the amount of urine, swelling of the ankles, hands, or feet Pancreatitis--severe stomach pain that spreads to your back or gets worse after eating or when touched, fever, nausea, vomiting Thyroid cancer--new mass or lump in the neck, pain or trouble swallowing, trouble breathing, hoarseness Side effects that usually do not require medical   attention (report to your care team if they continue or are bothersome): Diarrhea Loss of appetite Nausea Stomach pain Vomiting This list may not describe all possible side effects. Call your doctor for medical advice about side effects. You may report side effects to FDA at 1-800-FDA-1088. Where should I keep my medication? Keep out of the reach of children. Store  unopened pens in a refrigerator between 2 and 8 degrees C (36 and 46 degrees F). Do not freeze. Protect from light and heat. After you first use the pen, it can be stored for 56 days at room temperature between 15 and 30 degrees C (59 and 86 degrees F) or in a refrigerator. Throw away your used pen after 56 days or after the expiration date, whichever comes first. Do not store your pen with the needle attached. If the needle is left on, medication may leak from the pen. NOTE: This sheet is a summary. It may not cover all possible information. If you have questions about this medicine, talk to your doctor, pharmacist, or health care provider.  2023 Elsevier/Gold Standard (2020-05-27 00:00:00)  

## 2021-09-14 ENCOUNTER — Encounter: Payer: Self-pay | Admitting: Physician Assistant

## 2021-09-14 DIAGNOSIS — E1165 Type 2 diabetes mellitus with hyperglycemia: Secondary | ICD-10-CM | POA: Insufficient documentation

## 2021-09-14 MED ORDER — XIGDUO XR 10-1000 MG PO TB24: 1.0000 | ORAL_TABLET | Freq: Every day | ORAL | 0 refills | Status: DC

## 2021-09-14 MED ORDER — CANDESARTAN CILEXETIL-HCTZ 32-25 MG PO TABS: 1.0000 | ORAL_TABLET | Freq: Every day | ORAL | 1 refills | Status: DC

## 2021-12-13 ENCOUNTER — Ambulatory Visit: Payer: BC Managed Care – PPO | Admitting: Physician Assistant

## 2022-02-20 ENCOUNTER — Ambulatory Visit: Payer: BC Managed Care – PPO | Admitting: Physician Assistant

## 2022-02-20 ENCOUNTER — Encounter: Payer: Self-pay | Admitting: Physician Assistant

## 2022-02-20 VITALS — BP 153/78 | HR 66 | Ht 64.0 in | Wt 284.0 lb

## 2022-02-20 DIAGNOSIS — B351 Tinea unguium: Secondary | ICD-10-CM

## 2022-02-20 DIAGNOSIS — E785 Hyperlipidemia, unspecified: Secondary | ICD-10-CM | POA: Diagnosis not present

## 2022-02-20 DIAGNOSIS — R221 Localized swelling, mass and lump, neck: Secondary | ICD-10-CM

## 2022-02-20 DIAGNOSIS — Z79899 Other long term (current) drug therapy: Secondary | ICD-10-CM

## 2022-02-20 DIAGNOSIS — E1165 Type 2 diabetes mellitus with hyperglycemia: Secondary | ICD-10-CM

## 2022-02-20 DIAGNOSIS — E1159 Type 2 diabetes mellitus with other circulatory complications: Secondary | ICD-10-CM

## 2022-02-20 DIAGNOSIS — Z1329 Encounter for screening for other suspected endocrine disorder: Secondary | ICD-10-CM

## 2022-02-20 DIAGNOSIS — Z6841 Body Mass Index (BMI) 40.0 and over, adult: Secondary | ICD-10-CM

## 2022-02-20 DIAGNOSIS — I152 Hypertension secondary to endocrine disorders: Secondary | ICD-10-CM

## 2022-02-20 LAB — POCT GLYCOSYLATED HEMOGLOBIN (HGB A1C): Hemoglobin A1C: 8.4 % — AB (ref 4.0–5.6)

## 2022-02-20 MED ORDER — CICLOPIROX 8 % EX SOLN: Freq: Every day | CUTANEOUS | 1 refills | Status: DC

## 2022-02-20 NOTE — Patient Instructions (Addendum)
3 year follow up on neck   Tirzepatide Injection What is this medication? TIRZEPATIDE (tir ZEP a tide) treats type 2 diabetes. It works by increasing insulin levels in your body, which decreases your blood sugar (glucose). Changes to diet and exercise are often combined with this medication. This medicine may be used for other purposes; ask your health care provider or pharmacist if you have questions. COMMON BRAND NAME(S): MOUNJARO What should I tell my care team before I take this medication? They need to know if you have any of these conditions: Endocrine tumors (MEN 2) or if someone in your family had these tumors Eye disease, vision problems Gallbladder disease History of pancreatitis Kidney disease Stomach or intestine problems Thyroid cancer or if someone in your family had thyroid cancer An unusual or allergic reaction to tirzepatide, other medications, foods, dyes, or preservatives Pregnant or trying to get pregnant Breast-feeding How should I use this medication? This medication is injected under the skin. You will be taught how to prepare and give it. It is given once every week (every 7 days). Keep taking it unless your health care provider tells you to stop. If you use this medication with insulin, you should inject this medication and the insulin separately. Do not mix them together. Do not give the injections right next to each other. Change (rotate) injection sites with each injection. This medication comes with INSTRUCTIONS FOR USE. Ask your pharmacist for directions on how to use this medication. Read the information carefully. Talk to your pharmacist or care team if you have questions. It is important that you put your used needles and syringes in a special sharps container. Do not put them in a trash can. If you do not have a sharps container, call your pharmacist or care team to get one. A special MedGuide will be given to you by the pharmacist with each prescription and  refill. Be sure to read this information carefully each time. Talk to your care team about the use of this medication in children. Special care may be needed. Overdosage: If you think you have taken too much of this medicine contact a poison control center or emergency room at once. NOTE: This medicine is only for you. Do not share this medicine with others. What if I miss a dose? If you miss a dose, take it as soon as you can unless it is more than 4 days (96 hours) late. If it is more than 4 days late, skip the missed dose. Take the next dose at the normal time. Do not take 2 doses within 3 days of each other. What may interact with this medication? Alcohol Antiviral medications for HIV or AIDS Aspirin and aspirin-like medications Beta blockers, such as atenolol, metoprolol, propranolol Certain medications for blood pressure, heart disease, irregular heart beat Chromium Clonidine Diuretics Estrogen or progestin hormones Fenofibrate Gemfibrozil Guanethidine Isoniazid Lanreotide Female hormones or anabolic steroids MAOIs, such as Marplan, Nardil, and Parnate Medications for weight loss Medications for allergies, asthma, cold, or cough Medications for depression, anxiety, or mental health conditions Niacin Nicotine NSAIDs, medications for pain and inflammation, such as ibuprofen or naproxen Octreotide Other medications for diabetes, such as glyburide, glipizide, or glimepiride Pasireotide Pentamidine Phenytoin Probenecid Quinolone antibiotics, such as ciprofloxacin, levofloxacin, ofloxacin Reserpine Some herbal dietary supplements Steroid medications, such as prednisone or cortisone Sulfamethoxazole; trimethoprim Thyroid hormones Warfarin This list may not describe all possible interactions. Give your health care provider a list of all the medicines, herbs, non-prescription drugs,  or dietary supplements you use. Also tell them if you smoke, drink alcohol, or use illegal drugs.  Some items may interact with your medicine. What should I watch for while using this medication? Visit your care team for regular checks on your progress. Drink plenty of fluids while taking this medication. Check with your care team if you get an attack of severe diarrhea, nausea, and vomiting. The loss of too much body fluid can make it dangerous for you to take this medication. A test called the HbA1C (A1C) will be monitored. This is a simple blood test. It measures your blood sugar control over the last 2 to 3 months. You will receive this test every 3 to 6 months. Learn how to check your blood sugar. Learn the symptoms of low and high blood sugar and how to manage them. Always carry a quick-source of sugar with you in case you have symptoms of low blood sugar. Examples include hard sugar candy or glucose tablets. Make sure others know that you can choke if you eat or drink when you develop serious symptoms of low blood sugar, such as seizures or unconsciousness. They must get medical help at once. Tell your care team if you have high blood sugar. You might need to change the dose of your medication. If you are sick or exercising more than usual, you might need to change the dose of your medication. Do not skip meals. Ask your care team if you should avoid alcohol. Many nonprescription cough and cold products contain sugar or alcohol. These can affect blood sugar. Pens should never be shared. Even if the needle is changed, sharing may result in passing of viruses like hepatitis or HIV. Wear a medical ID bracelet or chain, and carry a card that describes your disease and details of your medication and dosage times. Birth control may not work properly while you are taking this medication. If you take birth control pills by mouth, your care team may recommend another type of birth control for 4 weeks after you start this medication and for 4 weeks after each increase in your dose of this medication. Ask  your care team which birth control methods you should use. What side effects may I notice from receiving this medication? Side effects that you should report to your care team as soon as possible: Allergic reactions or angioedema--skin rash, itching or hives, swelling of the face, eyes, lips, tongue, arms, or legs, trouble swallowing or breathing Change in vision Dehydration--increased thirst, dry mouth, feeling faint or lightheaded, headache, dark yellow or brown urine Gallbladder problems--severe stomach pain, nausea, vomiting, fever Kidney injury--decrease in the amount of urine, swelling of the ankles, hands, or feet Pancreatitis--severe stomach pain that spreads to your back or gets worse after eating or when touched, fever, nausea, vomiting Thyroid cancer--new mass or lump in the neck, pain or trouble swallowing, trouble breathing, hoarseness Side effects that usually do not require medical attention (report these to your care team if they continue or are bothersome): Constipation Diarrhea Loss of appetite Nausea Stomach pain Upset stomach Vomiting This list may not describe all possible side effects. Call your doctor for medical advice about side effects. You may report side effects to FDA at 1-800-FDA-1088. Where should I keep my medication? Keep out of the reach of children and pets. Refrigeration (preferred): Store in the refrigerator. Keep this medication in the original carton until you are ready to take it. Do not freeze. Protect from light. Get rid of opened  vials after use, even if there is medication left. Get rid of any unopened vials or pens after the expiration date. Room Temperature: This medication may be stored at room temperature below 30 degrees C (86 degrees F) for up to 21 days. Keep this medication in the original carton until you are ready to take it. Protect from light. Avoid exposure to extreme heat. Get rid of opened vials after use, even if there is medication  left. Get rid of any unopened vials or pens after 21 days, or after they expire, whichever is first. To get rid of medications that are no longer needed or have expired: Take the medication to a medication take-back program. Check with your pharmacy or law enforcement to find a location. If you cannot return the medication, ask your pharmacist or care team how to get rid of this medication safely. NOTE: This sheet is a summary. It may not cover all possible information. If you have questions about this medicine, talk to your doctor, pharmacist, or health care provider.  2023 Elsevier/Gold Standard (2020-07-21 00:00:00)

## 2022-02-20 NOTE — Progress Notes (Signed)
   Established Patient Office Visit  Subjective   Patient ID: Martha Adkins, female    DOB: 1965/11/02  Age: 56 y.o. MRN: [id number]  No chief complaint on file.   HPI  Checking morning 115/  Evening 150 to 160  Drinking too many sodas ROS    Objective:     There were no vitals taken for this visit. {Vitals History (Optional):23777}  Physical Exam   No results found for any visits on 02/20/22.  {Labs (Optional):23779}  The 10-year ASCVD risk score (Arnett DK, et al., 2019) is: 5.4%    Assessment & Plan:     Declined all vaccines.  Eye appt in next few weeks   Iran Planas, PA-C

## 2022-02-21 ENCOUNTER — Encounter: Payer: Self-pay | Admitting: Physician Assistant

## 2022-02-21 DIAGNOSIS — B351 Tinea unguium: Secondary | ICD-10-CM | POA: Insufficient documentation

## 2022-02-21 DIAGNOSIS — R221 Localized swelling, mass and lump, neck: Secondary | ICD-10-CM | POA: Insufficient documentation

## 2022-02-21 MED ORDER — CANDESARTAN CILEXETIL-HCTZ 32-25 MG PO TABS: 1.0000 | ORAL_TABLET | Freq: Every day | ORAL | 1 refills | Status: DC

## 2022-02-24 ENCOUNTER — Other Ambulatory Visit: Payer: BC Managed Care – PPO

## 2022-03-01 ENCOUNTER — Ambulatory Visit (INDEPENDENT_AMBULATORY_CARE_PROVIDER_SITE_OTHER): Payer: BC Managed Care – PPO

## 2022-03-01 DIAGNOSIS — R221 Localized swelling, mass and lump, neck: Secondary | ICD-10-CM

## 2022-03-01 NOTE — Progress Notes (Signed)
No abnormality in the suprasternal notch. You do have small thyroid nodule and right lymp hnode that is prominent under chin but none look concerning or need follow up.

## 2022-03-03 ENCOUNTER — Telehealth: Payer: Self-pay

## 2022-03-03 NOTE — Telephone Encounter (Addendum)
Initiated Prior authorization [id number] 8% solution Via: Covermymeds Case/Key:[id number] Status: approved as of 03/03/22 Reason:approved through 03/04/2023 Notified Pt via: Mychart

## 2022-03-15 LAB — HM MAMMOGRAPHY

## 2022-03-16 ENCOUNTER — Encounter: Payer: Self-pay | Admitting: Neurology

## 2022-03-22 LAB — HM PAP SMEAR: HM Pap smear: NEGATIVE

## 2022-04-03 ENCOUNTER — Encounter: Payer: Self-pay | Admitting: Physician Assistant

## 2022-04-20 ENCOUNTER — Encounter: Payer: Self-pay | Admitting: Physician Assistant

## 2022-05-22 ENCOUNTER — Ambulatory Visit: Payer: BC Managed Care – PPO | Admitting: Physician Assistant

## 2022-06-27 ENCOUNTER — Telehealth: Payer: Self-pay | Admitting: Physician Assistant

## 2022-06-27 DIAGNOSIS — E1165 Type 2 diabetes mellitus with hyperglycemia: Secondary | ICD-10-CM

## 2022-06-27 MED ORDER — DAPAGLIFLOZIN PRO-METFORMIN ER 10-1000 MG PO TB24: 1.0000 | ORAL_TABLET | Freq: Every day | ORAL | 0 refills | Status: DC

## 2022-06-27 NOTE — Telephone Encounter (Signed)
Sent!

## 2022-06-27 NOTE — Telephone Encounter (Signed)
Pt called. She is requesting refill on her Xigduo.  Pharmacy on file is correct.

## 2022-07-21 ENCOUNTER — Ambulatory Visit: Payer: BC Managed Care – PPO | Admitting: Physician Assistant

## 2022-07-21 ENCOUNTER — Encounter: Payer: Self-pay | Admitting: Physician Assistant

## 2022-07-21 VITALS — BP 134/70 | HR 66 | Ht 64.0 in | Wt 283.0 lb

## 2022-07-21 DIAGNOSIS — B351 Tinea unguium: Secondary | ICD-10-CM

## 2022-07-21 DIAGNOSIS — I152 Hypertension secondary to endocrine disorders: Secondary | ICD-10-CM | POA: Diagnosis not present

## 2022-07-21 DIAGNOSIS — E1169 Type 2 diabetes mellitus with other specified complication: Secondary | ICD-10-CM

## 2022-07-21 DIAGNOSIS — Z7984 Long term (current) use of oral hypoglycemic drugs: Secondary | ICD-10-CM

## 2022-07-21 DIAGNOSIS — E1159 Type 2 diabetes mellitus with other circulatory complications: Secondary | ICD-10-CM | POA: Diagnosis not present

## 2022-07-21 LAB — POCT GLYCOSYLATED HEMOGLOBIN (HGB A1C): Hemoglobin A1C: 7 % — AB (ref 4.0–5.6)

## 2022-07-21 MED ORDER — CANDESARTAN CILEXETIL-HCTZ 32-25 MG PO TABS: 1.0000 | ORAL_TABLET | Freq: Every day | ORAL | 1 refills | Status: DC

## 2022-07-21 MED ORDER — CICLOPIROX 8 % EX SOLN: Freq: Every day | CUTANEOUS | 1 refills | Status: DC

## 2022-07-21 NOTE — Progress Notes (Signed)
Established Patient Office Visit  Subjective   Patient ID: Martha Adkins, female    DOB: Aug 13, 1965  Age: 57 y.o. MRN: [id number]  Chief Complaint  Patient presents with   Diabetes    Pt would like to think about Shingrix. DM eye exam records request sent to Dr. Neale Burly    HPI Pt is a 57 yo obese female with HTN, T2DM, HLd who presents to the clinic for follow up.   Pt is doing well. She is checking her sugars and her range is 7.0-7.3 on her A1C home checker. No open sores or wounds. No hypoglycemic events. No CP, palpitations, headaches. She is more active and watching what she eats. She is taking xigduo daily.   Needs refill on penlac. Nails are growing out well.   Active Ambulatory Problems    Diagnosis Date Noted   Perimenopausal 08/30/2016   Elevated blood pressure reading in office with diagnosis of hypertension 08/30/2016   Hypertension goal BP (blood pressure) < 130/80 09/13/2016   Eustachian tube dysfunction, right 09/13/2016   At risk for obstructive sleep apnea 09/13/2016   Hypertension associated with diabetes (HCC) 09/29/2016   Class 3 severe obesity due to excess calories without serious comorbidity with body mass index (BMI) of 45.0 to 49.9 in adult (HCC) 03/08/2017   History of melanoma excision 03/08/2017   Type II diabetes mellitus with complication (HCC) 01/15/2018   Cervicalgia 01/15/2018   Cervical paraspinal muscle spasm 01/15/2018   Nonscarring hair loss 07/18/2018   Hyperlipidemia associated with type 2 diabetes mellitus (HCC) 07/18/2018   Dyslipidemia, goal LDL below 70 07/23/2020   Statin medication declined by patient 07/23/2020   Uncontrolled type 2 diabetes mellitus with hyperglycemia (HCC) 09/14/2021   Toenail fungus 02/21/2022   Fullness of neck 02/21/2022   Resolved Ambulatory Problems    Diagnosis Date Noted   Middle ear effusion, right 09/13/2016   Need for hepatitis B vaccination 10/02/2018   Past Medical History:  Diagnosis Date    Melanoma (HCC) 1992   Obesity    Vitamin D deficiency      ROS   See HPI.  Objective:     BP 134/70   Pulse 66   Ht 5\' 4"  (1.626 m)   Wt 283 lb (128.4 kg)   SpO2 95%   BMI 48.58 kg/m  BP Readings from Last 3 Encounters:  07/21/22 134/70  02/20/22 (!) 153/78  09/12/21 120/81   Wt Readings from Last 3 Encounters:  07/21/22 283 lb (128.4 kg)  02/20/22 284 lb 0.6 oz (128.8 kg)  09/12/21 288 lb (130.6 kg)     .[name] Results for orders placed or performed in visit on 07/21/22  POCT glycosylated hemoglobin (Hb A1C)  Result Value Ref Range   Hemoglobin A1C 7.0 (A) 4.0 - 5.6 %   HbA1c POC (<> result, manual entry)     HbA1c, POC (prediabetic range)     HbA1c, POC (controlled diabetic range)      Physical Exam Constitutional:      Appearance: Normal appearance. She is obese.  HENT:     Head: Normocephalic.  Cardiovascular:     Rate and Rhythm: Normal rate and regular rhythm.     Pulses: Normal pulses.     Heart sounds: Normal heart sounds.  Pulmonary:     Effort: Pulmonary effort is normal.     Breath sounds: Normal breath sounds.  Skin:    Comments: Good nail growing out from cuticle of both great toenail  with dystrophic tip  Neurological:     General: No focal deficit present.     Mental Status: She is alert and oriented to person, place, and time.  Psychiatric:        Mood and Affect: Mood normal.      The 10-year ASCVD risk score (Arnett DK, et al., 2019) is: 6.7%    Assessment & Plan:  Marland KitchenMarland KitchenKhalil was seen today for diabetes.  Diagnoses and all orders for this visit:  Controlled type 2 diabetes mellitus with other specified complication, without long-term current use of insulin (HCC) -     POCT glycosylated hemoglobin (Hb A1C)  Toenail fungus -     ciclopirox (PENLAC) 8 % solution; Apply topically at bedtime. Apply over nail and surrounding skin. Apply daily over previous coat. After seven (7) days, may remove with alcohol and continue  cycle.  Hypertension associated with diabetes (HCC) -     Candesartan Cilexetil-HCTZ 32-25 MG TABS; Take 1 tablet by mouth daily.   A1C is 7.0 to goal Stay on same medication BP to goal on ARB Declined statin .[name] Diabetic Foot Exam - Simple   Simple Foot Form Diabetic Foot exam was performed with the following findings: Yes 07/21/2022 11:26 AM  Visual Inspection No deformities, no ulcerations, no other skin breakdown bilaterally: Yes Sensation Testing Intact to touch and monofilament testing bilaterally: Yes Pulse Check Posterior Tibialis and Dorsalis pulse intact bilaterally: Yes Comments    Eye exam UTD Declines vaccines Follow up in 3 months  Penlac refilled nails are growing out healthy   Tandy Gaw, PA-C

## 2022-07-21 NOTE — Patient Instructions (Signed)
A1C much better today at 7.0 GREAT job  Keep up the good work

## 2022-10-02 ENCOUNTER — Other Ambulatory Visit: Payer: Self-pay | Admitting: Physician Assistant

## 2022-10-02 DIAGNOSIS — E1165 Type 2 diabetes mellitus with hyperglycemia: Secondary | ICD-10-CM

## 2022-10-23 ENCOUNTER — Ambulatory Visit: Payer: BC Managed Care – PPO | Admitting: Physician Assistant

## 2022-10-23 ENCOUNTER — Encounter: Payer: Self-pay | Admitting: Physician Assistant

## 2022-10-23 VITALS — BP 127/88 | HR 74 | Ht 64.0 in | Wt 286.0 lb

## 2022-10-23 DIAGNOSIS — E785 Hyperlipidemia, unspecified: Secondary | ICD-10-CM | POA: Diagnosis not present

## 2022-10-23 DIAGNOSIS — E1165 Type 2 diabetes mellitus with hyperglycemia: Secondary | ICD-10-CM | POA: Diagnosis not present

## 2022-10-23 DIAGNOSIS — Z7984 Long term (current) use of oral hypoglycemic drugs: Secondary | ICD-10-CM

## 2022-10-23 DIAGNOSIS — Z5320 Procedure and treatment not carried out because of patient's decision for unspecified reasons: Secondary | ICD-10-CM

## 2022-10-23 DIAGNOSIS — I10 Essential (primary) hypertension: Secondary | ICD-10-CM | POA: Diagnosis not present

## 2022-10-23 LAB — POCT UA - MICROALBUMIN
Albumin/Creatinine Ratio, Urine, POC: <30
Creatinine, POC: 100 mg/dL
Microalbumin Ur, POC: 30 mg/L

## 2022-10-23 LAB — POCT GLYCOSYLATED HEMOGLOBIN (HGB A1C): Hemoglobin A1C: 7.2 % — AB (ref 4.0–5.6)

## 2022-10-23 NOTE — Progress Notes (Signed)
Established Patient Office Visit  Subjective   Patient ID: Martha Adkins, female    DOB: [birth year]  Age: 57 y.o. MRN: [id number]  Chief Complaint  Patient presents with   Medical Management of Chronic Issues    Last     HPI  Checking at home at home 130/80 Yard work  Dog part 2 times a week walk a mile .[name] Active Ambulatory Problems    Diagnosis Date Noted   Perimenopausal 08/30/2016   Elevated blood pressure reading in office with diagnosis of hypertension 08/30/2016   Hypertension goal BP (blood pressure) < 130/80 09/13/2016   Eustachian tube dysfunction, right 09/13/2016   At risk for obstructive sleep apnea 09/13/2016   Hypertension associated with diabetes (HCC) 09/29/2016   Class 3 severe obesity due to excess calories without serious comorbidity with body mass index (BMI) of 45.0 to 49.9 in adult (HCC) 03/08/2017   History of melanoma excision 03/08/2017   Type II diabetes mellitus with complication (HCC) 01/15/2018   Cervicalgia 01/15/2018   Cervical paraspinal muscle spasm 01/15/2018   Nonscarring hair loss 07/18/2018   Hyperlipidemia associated with type 2 diabetes mellitus (HCC) 07/18/2018   Dyslipidemia, goal LDL below 70 07/23/2020   Statin medication declined by patient 07/23/2020   Uncontrolled type 2 diabetes mellitus with hyperglycemia (HCC) 09/14/2021   Toenail fungus 02/21/2022   Fullness of neck 02/21/2022   Morbid obesity (HCC) 10/23/2022   Resolved Ambulatory Problems    Diagnosis Date Noted   Middle ear effusion, right 09/13/2016   Need for hepatitis B vaccination 10/02/2018   Past Medical History:  Diagnosis Date   Melanoma (HCC) 1992   Obesity    Vitamin D deficiency      ROS See HPI.    Objective:     BP 127/88   Pulse 74   Ht 5\' 4"  (1.626 m)   Wt 286 lb (129.7 kg)   SpO2 99%   BMI 49.09 kg/m  BP Readings from Last 3 Encounters:  10/23/22 127/88  07/21/22 134/70  02/20/22 (!) 153/78   Wt Readings from Last 3  Encounters:  10/23/22 286 lb (129.7 kg)  07/21/22 283 lb (128.4 kg)  02/20/22 284 lb 0.6 oz (128.8 kg)    .[name] Results for orders placed or performed in visit on 10/23/22  POCT HgB A1C  Result Value Ref Range   Hemoglobin A1C 7.2 (A) 4.0 - 5.6 %   HbA1c POC (<> result, manual entry)     HbA1c, POC (prediabetic range)     HbA1c, POC (controlled diabetic range)    POCT UA - Microalbumin  Result Value Ref Range   Microalbumin Ur, POC 30 mg/L   Creatinine, POC 100 mg/dL   Albumin/Creatinine Ratio, Urine, POC <30      Physical Exam Constitutional:      Appearance: Normal appearance. She is obese.  HENT:     Head: Normocephalic.  Cardiovascular:     Rate and Rhythm: Normal rate and regular rhythm.     Pulses: Normal pulses.     Heart sounds: Normal heart sounds.  Pulmonary:     Effort: Pulmonary effort is normal.     Breath sounds: Normal breath sounds.  Musculoskeletal:     Right lower leg: No edema.     Left lower leg: No edema.  Skin:    Comments: 2+ pedal pulses.   Neurological:     General: No focal deficit present.     Mental Status: She is alert  and oriented to person, place, and time.  Psychiatric:        Mood and Affect: Mood normal.       The 10-year ASCVD risk score (Arnett DK, et al., 2019) is: 6%    Assessment & Plan:  Marland KitchenMarland KitchenLundin was seen today for medical management of chronic issues.  Diagnoses and all orders for this visit:  Uncontrolled type 2 diabetes mellitus with hyperglycemia (HCC) -     POCT HgB A1C -     POCT UA - Microalbumin  Morbid obesity (HCC)  Hypertension goal BP (blood pressure) < 130/80  Dyslipidemia, goal LDL below 70  Statin medication declined by patient   A1C not to goal of under 7 Pt declined adding any medication today Will work harder on diet and exercise Refilled xigduo Declined statin BP to goal Declined flu and covid vaccines Follow up in 3 months     Return in about 3 months (around 01/23/2023).     Tandy Gaw, PA-C

## 2022-11-24 ENCOUNTER — Telehealth: Payer: Self-pay | Admitting: Family Medicine

## 2022-11-24 DIAGNOSIS — E1165 Type 2 diabetes mellitus with hyperglycemia: Secondary | ICD-10-CM

## 2022-11-24 DIAGNOSIS — I10 Essential (primary) hypertension: Secondary | ICD-10-CM

## 2022-11-24 NOTE — Telephone Encounter (Signed)
Call pt: She is due to recheck her renal function with blood test.  It has been more than 6 months.  If she could come by 1 day and have that drawn that would be wonderful.  No need to fast.

## 2022-11-24 NOTE — Telephone Encounter (Signed)
Patient informed. 

## 2022-11-24 NOTE — Telephone Encounter (Signed)
Error. Patient not yet informed. Called and left a voice mail message requesting a return call.

## 2022-11-24 NOTE — Telephone Encounter (Signed)
Patient informed. Will come by office on Wednesday 11/29/22 to have this lab work drawn.

## 2023-01-12 ENCOUNTER — Other Ambulatory Visit: Payer: Self-pay | Admitting: Physician Assistant

## 2023-01-12 DIAGNOSIS — E1165 Type 2 diabetes mellitus with hyperglycemia: Secondary | ICD-10-CM

## 2023-01-23 ENCOUNTER — Ambulatory Visit: Payer: BC Managed Care – PPO | Admitting: Physician Assistant

## 2023-03-30 LAB — HM PAP SMEAR

## 2023-04-13 ENCOUNTER — Other Ambulatory Visit: Payer: Self-pay

## 2023-04-13 ENCOUNTER — Ambulatory Visit
Admission: RE | Admit: 2023-04-13 | Discharge: 2023-04-13 | Disposition: A | Discharge status: Home / Self Care | Payer: 59 | Source: Ambulatory Visit | Attending: Family Medicine | Admitting: Family Medicine

## 2023-04-13 VITALS — BP 147/84 | HR 62 | Temp 97.6°F | Resp 16

## 2023-04-13 DIAGNOSIS — J0100 Acute maxillary sinusitis, unspecified: Secondary | ICD-10-CM | POA: Diagnosis not present

## 2023-04-13 MED ORDER — AMOXICILLIN-POT CLAVULANATE 875-125 MG PO TABS: 1.0000 | ORAL_TABLET | Freq: Two times a day (BID) | ORAL | 0 refills | Status: DC

## 2023-04-13 NOTE — ED Provider Notes (Signed)
 [facility]    CSN: [id number] Arrival date & time: 04/13/23  1101      History   Chief Complaint Chief Complaint  Patient presents with   Cough   facial pressure     HPI Martha Adkins is a 58 y.o. female.   HPI 58 year old female presents with cough and head congestion for 1 week.  PMH significant for morbid obesity, vitamin D deficiency, and melanoma.  Past Medical History:  Diagnosis Date   Eustachian tube dysfunction, right 09/13/2016   Hypertension goal BP (blood pressure) < 130/80 09/13/2016   Melanoma (HCC) 1992   back   Obesity    Perimenopausal 08/30/2016   Vitamin D deficiency     Patient Active Problem List   Diagnosis Date Noted   Morbid obesity (HCC) 10/23/2022   Toenail fungus 02/21/2022   Fullness of neck 02/21/2022   Uncontrolled type 2 diabetes mellitus with hyperglycemia (HCC) 09/14/2021   Dyslipidemia, goal LDL below 70 07/23/2020   Statin medication declined by patient 07/23/2020   Nonscarring hair loss 07/18/2018   Hyperlipidemia associated with type 2 diabetes mellitus (HCC) 07/18/2018   Type II diabetes mellitus with complication (HCC) 01/15/2018   Cervicalgia 01/15/2018   Cervical paraspinal muscle spasm 01/15/2018   Class 3 severe obesity due to excess calories without serious comorbidity with body mass index (BMI) of 45.0 to 49.9 in adult [facility]) 03/08/2017   History of melanoma excision 03/08/2017   Hypertension associated with diabetes (HCC) 09/29/2016   Hypertension goal BP (blood pressure) < 130/80 09/13/2016   Eustachian tube dysfunction, right 09/13/2016   At risk for obstructive sleep apnea 09/13/2016   Perimenopausal 08/30/2016   Elevated blood pressure reading in office with diagnosis of hypertension 08/30/2016    Past Surgical History:  Procedure Laterality Date   ADENOIDECTOMY     CLOSED REDUCTION FINGER WITH PERCUTANEOUS PINNING     COLPOSCOPY  1992   laser   MELANOMA EXCISION     Back    OB History   No  obstetric history on file.      Home Medications    Prior to Admission medications   Medication Sig Start Date End Date Taking? Authorizing Provider  amoxicillin -clavulanate (AUGMENTIN ) 875-125 MG tablet Take 1 tablet by mouth every 12 (twelve) hours. 04/13/23  Yes Teddy Sharper, FNP  blood glucose meter kit and supplies KIT Check morning fasting glucose daily and up to QID prn 07/18/18   Tommas Severa Norris, PA-C  Candesartan  Cilexetil-HCTZ 32-25 MG TABS Take 1 tablet by mouth daily. 07/21/22   Breeback, Jade L, PA-C  ciclopirox  (PENLAC ) 8 % solution Apply topically at bedtime. Apply over nail and surrounding skin. Apply daily over previous coat. After seven (7) days, may remove with alcohol and continue cycle. 07/21/22   Breeback, Jade L, PA-C  Ergocalciferol (VITAMIN D2 PO) Take 5,000 mg by mouth.    [provider]  glucose blood (CONTOUR TEST) test strip Use as instructed 11/25/19   Breeback, Jade L, PA-C  Multiple Vitamins-Minerals (MULTIVITAMIN ADULT PO) Take by mouth.    [provider]  omega-3 acid ethyl esters (LOVAZA) 1 g capsule Take by mouth 2 (two) times daily.    [provider]  XIGDUO  XR 12-998 MG TB24 TAKE 1 TABLET BY MOUTH DAILY 01/15/23   Breeback, Jade L, PA-C    Family History Family History  Problem Relation Age of Onset   COPD Mother    Prostate cancer Father    Hyperlipidemia  Father     Social History Social History   Tobacco Use   Smoking status: Never    Passive exposure: Yes   Smokeless tobacco: Never  Vaping Use   Vaping status: Never Used  Substance Use Topics   Alcohol use: Yes    Comment: 2 / month   Drug use: No     Allergies   Cortisone and Supartz [sodium hyaluronate (avian)]   Review of Systems Review of Systems  HENT:  Positive for congestion, sinus pressure and sinus pain.      Physical Exam Triage Vital Signs ED Triage Vitals  Encounter Vitals Group     BP 04/13/23 1118 (!) 147/84      Systolic BP Percentile --      Diastolic BP Percentile --      Pulse Rate 04/13/23 1118 62     Resp 04/13/23 1118 16     Temp 04/13/23 1118 97.6 F (36.4 C)     Temp src --      SpO2 04/13/23 1118 98 %     Weight --      Height --      Head Circumference --      Peak Flow --      Pain Score 04/13/23 1117 3     Pain Loc --      Pain Education --      Exclude from Growth Chart --    No data found.  Updated Vital Signs BP (!) 147/84   Pulse 62   Temp 97.6 F (36.4 C)   Resp 16   SpO2 98%    Physical Exam Vitals and nursing note reviewed.  Constitutional:      Appearance: Normal appearance. She is obese. She is ill-appearing.  HENT:     Head: Normocephalic and atraumatic.     Right Ear: Tympanic membrane and external ear normal.     Left Ear: Tympanic membrane and external ear normal.     Ears:     Comments: Significant eustachian tube dysfunction noted bilaterally    Mouth/Throat:     Mouth: Mucous membranes are moist.     Pharynx: Oropharynx is clear.  Eyes:     Extraocular Movements: Extraocular movements intact.     Conjunctiva/sclera: Conjunctivae normal.     Pupils: Pupils are equal, round, and reactive to light.  Cardiovascular:     Rate and Rhythm: Normal rate and regular rhythm.     Pulses: Normal pulses.     Heart sounds: Normal heart sounds.  Pulmonary:     Effort: Pulmonary effort is normal.     Breath sounds: Normal breath sounds. No wheezing, rhonchi or rales.  Musculoskeletal:        General: Normal range of motion.     Cervical back: Normal range of motion and neck supple.  Skin:    General: Skin is warm and dry.  Neurological:     General: No focal deficit present.     Mental Status: She is alert and oriented to person, place, and time. Mental status is at baseline.  Psychiatric:        Mood and Affect: Mood normal.        Behavior: Behavior normal.      UC Treatments / Results  Labs (all labs ordered are listed, but only abnormal  results are displayed) Labs Reviewed - No data to display  EKG   Radiology No results found.  Procedures Procedures (including critical care time)  Medications Ordered in UC Medications - No data to display  Initial Impression / Assessment and Plan / UC Course  I have reviewed the triage vital signs and the nursing notes.  Pertinent labs & imaging results that were available during my care of the patient were reviewed by me and considered in my medical decision making (see chart for details).     MDM: 1.  Acute maxillary sinusitis, recurrence not specified-Rx'd Augmentin  875/125 mg tablet: Take 1 tablet twice daily x 7 days. Advised patient to take medication as directed with food to completion.  Encouraged to increase daily water intake to 64 ounces per day while taking this medication.  Advised if symptoms worsen and/or unresolved please follow-up with PCP or here for further evaluation.  Patient discharged home, hemodynamically stable. Final Clinical Impressions(s) / UC Diagnoses   Final diagnoses:  Acute maxillary sinusitis, recurrence not specified     Discharge Instructions      Advised patient to take medication as directed with food to completion.  Encouraged to increase daily water intake to 64 ounces per day while taking this medication.  Advised if symptoms worsen and/or unresolved please follow-up with PCP or here for further evaluation.     ED Prescriptions     Medication Sig Dispense Auth. Provider   amoxicillin -clavulanate (AUGMENTIN ) 875-125 MG tablet Take 1 tablet by mouth every 12 (twelve) hours. 14 tablet Taven Strite, FNP      PDMP not reviewed this encounter.   Teddy Sharper, FNP 04/13/23 1148

## 2023-04-13 NOTE — Discharge Instructions (Addendum)
 Advised patient to take medication as directed with food to completion.  Encouraged to increase daily water intake to 64 ounces per day while taking this medication.  Advised if symptoms worsen and/or unresolved please follow-up with PCP or here for further evaluation

## 2023-04-13 NOTE — ED Triage Notes (Signed)
 Pt presents to uc with co og head congestion for one week. Pt reports she is concerned for sinus infection she reports last week she had a cough but that has since resolved. Pt reports motrin and musinex denies fever and sick contacts

## 2023-05-03 ENCOUNTER — Ambulatory Visit: Payer: 59

## 2023-05-03 ENCOUNTER — Telehealth: Payer: Self-pay

## 2023-05-03 ENCOUNTER — Ambulatory Visit
Admission: RE | Admit: 2023-05-03 | Discharge: 2023-05-03 | Disposition: A | Discharge status: Home / Self Care | Payer: Self-pay | Source: Ambulatory Visit | Attending: Family Medicine | Admitting: Family Medicine

## 2023-05-03 VITALS — BP 136/77 | HR 70 | Temp 98.5°F | Resp 18 | Ht 64.0 in | Wt 280.0 lb

## 2023-05-03 DIAGNOSIS — M5010 Cervical disc disorder with radiculopathy, unspecified cervical region: Secondary | ICD-10-CM | POA: Diagnosis not present

## 2023-05-03 DIAGNOSIS — M25511 Pain in right shoulder: Secondary | ICD-10-CM | POA: Diagnosis not present

## 2023-05-03 DIAGNOSIS — M4802 Spinal stenosis, cervical region: Secondary | ICD-10-CM

## 2023-05-03 DIAGNOSIS — M62838 Other muscle spasm: Secondary | ICD-10-CM | POA: Diagnosis not present

## 2023-05-03 DIAGNOSIS — S4991XA Unspecified injury of right shoulder and upper arm, initial encounter: Secondary | ICD-10-CM | POA: Diagnosis not present

## 2023-05-03 DIAGNOSIS — S161XXA Strain of muscle, fascia and tendon at neck level, initial encounter: Secondary | ICD-10-CM | POA: Diagnosis not present

## 2023-05-03 MED ORDER — METHOCARBAMOL 500 MG PO TABS: 500.0000 mg | ORAL_TABLET | Freq: Three times a day (TID) | ORAL | 0 refills | Status: DC | PRN

## 2023-05-03 MED ORDER — CELECOXIB 200 MG PO CAPS: 200.0000 mg | ORAL_CAPSULE | Freq: Every day | ORAL | 0 refills | Status: DC

## 2023-05-03 MED ORDER — PREDNISONE 10 MG (21) PO TBPK: ORAL_TABLET | Freq: Every day | ORAL | 0 refills | Status: DC

## 2023-05-03 NOTE — Telephone Encounter (Signed)
 Spoke with patient about previous.

## 2023-05-03 NOTE — Discharge Instructions (Addendum)
 Advised patient to take medications as directed with food to completion.  Advised may take Robaxin daily or as needed for muscle spasms.  Encouraged increase daily water intake to 64 ounces per day while taking these medications.  Advised if symptoms worsen and/or unresolved please follow-up with your PCP or [facility] for further evaluation.  Contact information provided with his AVS today.

## 2023-05-03 NOTE — ED Triage Notes (Signed)
 Pt states that she has right shoulder pain that radiates from her neck down to her elbow. Pt states that she can lift her arm to minimal extent.  X2 days

## 2023-05-03 NOTE — ED Provider Notes (Signed)
 [facility]    CSN: [id number] Arrival date & time: 05/03/23  1105      History   Chief Complaint Chief Complaint  Patient presents with   Shoulder Pain    Experiencing major pain from the right side of my neck up high down through the shoulder and down the arm to my elbow - Entered by patient    HPI Martha Adkins is a 58 y.o. female.   HPI Pleasant 58 year old female presents with right shoulder pain that radiates from her neck down to her elbow.  Patient reports only can lift her right arm to a minimal extent for 2 days.  PMH significant for morbid obesity, HTN, and uncontrolled T2 DM with hyperglycemia.  Past Medical History:  Diagnosis Date   Eustachian tube dysfunction, right 09/13/2016   Hypertension goal BP (blood pressure) < 130/80 09/13/2016   Melanoma (HCC) 1992   back   Obesity    Perimenopausal 08/30/2016   Vitamin D deficiency     Patient Active Problem List   Diagnosis Date Noted   Morbid obesity (HCC) 10/23/2022   Toenail fungus 02/21/2022   Fullness of neck 02/21/2022   Uncontrolled type 2 diabetes mellitus with hyperglycemia (HCC) 09/14/2021   Dyslipidemia, goal LDL below 70 07/23/2020   Statin medication declined by patient 07/23/2020   Nonscarring hair loss 07/18/2018   Hyperlipidemia associated with type 2 diabetes mellitus (HCC) 07/18/2018   Type II diabetes mellitus with complication (HCC) 01/15/2018   Cervicalgia 01/15/2018   Cervical paraspinal muscle spasm 01/15/2018   Class 3 severe obesity due to excess calories without serious comorbidity with body mass index (BMI) of 45.0 to 49.9 in adult [facility]) 03/08/2017   History of melanoma excision 03/08/2017   Hypertension associated with diabetes (HCC) 09/29/2016   Hypertension goal BP (blood pressure) < 130/80 09/13/2016   Eustachian tube dysfunction, right 09/13/2016   At risk for obstructive sleep apnea 09/13/2016   Perimenopausal 08/30/2016   Elevated blood pressure reading in  office with diagnosis of hypertension 08/30/2016    Past Surgical History:  Procedure Laterality Date   ADENOIDECTOMY     CLOSED REDUCTION FINGER WITH PERCUTANEOUS PINNING     COLPOSCOPY  1992   laser   MELANOMA EXCISION     Back    OB History   No obstetric history on file.      Home Medications    Prior to Admission medications   Medication Sig Start Date End Date Taking? Authorizing Provider  blood glucose meter kit and supplies KIT Check morning fasting glucose daily and up to QID prn 07/18/18  Yes Cummings, Bernerd Pho, PA-C  Candesartan Cilexetil-HCTZ 32-25 MG TABS Take 1 tablet by mouth daily. 07/21/22  Yes Breeback, Jade L, PA-C  celecoxib (CELEBREX) 200 MG capsule Take 1 capsule (200 mg total) by mouth daily for 15 days. 05/03/23 05/18/23 Yes Trevor Iha, FNP  ciclopirox (PENLAC) 8 % solution Apply topically at bedtime. Apply over nail and surrounding skin. Apply daily over previous coat. After seven (7) days, may remove with alcohol and continue cycle. 07/21/22  Yes Breeback, Jade L, PA-C  Ergocalciferol (VITAMIN D2 PO) Take 5,000 mg by mouth.   Yes [provider]  glucose blood (CONTOUR TEST) test strip Use as instructed 11/25/19  Yes Breeback, Jade L, PA-C  methocarbamol (ROBAXIN) 500 MG tablet Take 1 tablet (500 mg total) by mouth 3 (three) times daily as needed. 05/03/23  Yes Trevor Iha, FNP  Multiple Vitamins-Minerals (MULTIVITAMIN  ADULT PO) Take by mouth.   Yes [provider]  omega-3 acid ethyl esters (LOVAZA) 1 g capsule Take by mouth 2 (two) times daily.   Yes [provider]  predniSONE (STERAPRED UNI-PAK 21 TAB) 10 MG (21) TBPK tablet Take by mouth daily. Take 6 tabs by mouth daily  for 2 days, then 5 tabs for 2 days, then 4 tabs for 2 days, then 3 tabs for 2 days, 2 tabs for 2 days, then 1 tab by mouth daily for 2 days 05/03/23  Yes Trevor Iha, FNP  XIGDUO XR 12-998 MG TB24 TAKE 1 TABLET BY MOUTH DAILY 01/15/23  Yes  Jomarie Longs, PA-C    Family History Family History  Problem Relation Age of Onset   COPD Mother    Prostate cancer Father    Hyperlipidemia Father     Social History Social History   Tobacco Use   Smoking status: Never    Passive exposure: Yes   Smokeless tobacco: Never  Vaping Use   Vaping status: Never Used  Substance Use Topics   Alcohol use: Yes    Comment: 2 / month   Drug use: No     Allergies   Cortisone and Supartz [sodium hyaluronate (avian)]   Review of Systems Review of Systems   Physical Exam Triage Vital Signs ED Triage Vitals  Encounter Vitals Group     BP      Systolic BP Percentile      Diastolic BP Percentile      Pulse      Resp      Temp      Temp src      SpO2      Weight      Height      Head Circumference      Peak Flow      Pain Score      Pain Loc      Pain Education      Exclude from Growth Chart    No data found.  Updated Vital Signs BP 136/77 (BP Location: Left Arm)   Pulse 70   Temp 98.5 F (36.9 C) (Oral)   Resp 18   Ht 5\' 4"  (1.626 m)   Wt 280 lb (127 kg)   SpO2 96%   BMI 48.06 kg/m    Physical Exam Vitals and nursing note reviewed.  Constitutional:      Appearance: Normal appearance. She is obese.  HENT:     Head: Normocephalic and atraumatic.     Mouth/Throat:     Mouth: Mucous membranes are moist.     Pharynx: Oropharynx is clear.  Eyes:     Extraocular Movements: Extraocular movements intact.     Conjunctiva/sclera: Conjunctivae normal.     Pupils: Pupils are equal, round, and reactive to light.  Cardiovascular:     Rate and Rhythm: Normal rate and regular rhythm.     Pulses: Normal pulses.     Heart sounds: Normal heart sounds.  Pulmonary:     Effort: Pulmonary effort is normal.     Breath sounds: Normal breath sounds. No wheezing, rhonchi or rales.  Chest:     Chest wall: No tenderness.  Musculoskeletal:        General: Normal range of motion.     Cervical back: Normal range of  motion and neck supple.     Comments: Right shoulder: Full range of motion intact  Skin:    General: Skin is warm  and dry.  Neurological:     General: No focal deficit present.     Mental Status: She is alert and oriented to person, place, and time. Mental status is at baseline.  Psychiatric:        Mood and Affect: Mood normal.        Behavior: Behavior normal.      UC Treatments / Results  Labs (all labs ordered are listed, but only abnormal results are displayed) Labs Reviewed - No data to display  EKG   Radiology DG Shoulder Right Result Date: 05/03/2023 CLINICAL DATA:  Right shoulder pain for 2 days.  Reported injury. EXAM: RIGHT SHOULDER - 2+ VIEW COMPARISON:  None Available. FINDINGS: There is no evidence of fracture or dislocation. There is no evidence of arthropathy or other focal bone abnormality. Soft tissues are unremarkable. IMPRESSION: Negative. Electronically Signed   By: Lupita Raider M.D.   On: 05/03/2023 15:22   DG Cervical Spine Complete Result Date: 05/03/2023 CLINICAL DATA:  Right shoulder pain for 2 days with radiculopathy. EXAM: CERVICAL SPINE - COMPLETE 4+ VIEW COMPARISON:  None Available. FINDINGS: Minimal grade 1 retrolisthesis of C3-4 is noted secondary to moderate degenerative disc disease at this level. Moderate degenerative disc disease is also noted at C4-5 and C5-6. Mild degenerative disc disease is noted at C6-7. No fracture or prevertebral soft tissue swelling is noted. Mild to moderate bilateral neural foraminal stenosis is noted from C3-4 to C6-7 secondary to uncovertebral spurring. IMPRESSION: Multilevel degenerative disc disease and bilateral neural foraminal stenosis as noted above. No acute abnormality seen. Electronically Signed   By: Lupita Raider M.D.   On: 05/03/2023 15:18    Procedures Procedures (including critical care time)  Medications Ordered in UC Medications - No data to display  Initial Impression / Assessment and Plan / UC  Course  I have reviewed the triage vital signs and the nursing notes.  Pertinent labs & imaging results that were available during my care of the patient were reviewed by me and considered in my medical decision making (see chart for details).     MDM: 1.  Acute pain of right shoulder-right shoulder x-ray results revealed above, Rx'd Celebrex 200 mg capsule: Take 1 capsule daily x 15 days; 2.  Cervical strain acute, initial encounter-Rx'd Sterapred Unipak (tapering from 60 mg to 10 mg over 10 days; 3.  Muscle spasms of neck-Rx'd Robaxin 500 mg tablet: Take 1 tablet 3 times daily, as needed. Advised patient to take medications as directed with food to completion.  Advised may take Robaxin daily or as needed for muscle spasms.  Encouraged increase daily water intake to 64 ounces per day while taking these medications.  Advised if symptoms worsen and/or unresolved please follow-up with your PCP or [facility] for further evaluation.  Contact information provided with his AVS today.  Patient discharged home, hemodynamically stable. Final Clinical Impressions(s) / UC Diagnoses   Final diagnoses:  Acute pain of right shoulder  Cervical strain, acute, initial encounter  Muscle spasms of neck     Discharge Instructions      Advised patient to take medications as directed with food to completion.  Advised may take Robaxin daily or as needed for muscle spasms.  Encouraged increase daily water intake to 64 ounces per day while taking these medications.  Advised if symptoms worsen and/or unresolved please follow-up with your PCP or [facility] for further evaluation.  Contact information provided with his AVS today.  ED Prescriptions     Medication Sig Dispense Auth. Provider   celecoxib (CELEBREX) 200 MG capsule Take 1 capsule (200 mg total) by mouth daily for 15 days. 15 capsule Trevor Iha, FNP   predniSONE (STERAPRED UNI-PAK 21 TAB) 10 MG (21) TBPK tablet Take by  mouth daily. Take 6 tabs by mouth daily  for 2 days, then 5 tabs for 2 days, then 4 tabs for 2 days, then 3 tabs for 2 days, 2 tabs for 2 days, then 1 tab by mouth daily for 2 days 42 tablet Trevor Iha, FNP   methocarbamol (ROBAXIN) 500 MG tablet Take 1 tablet (500 mg total) by mouth 3 (three) times daily as needed. 30 tablet Trevor Iha, FNP      PDMP not reviewed this encounter.   Trevor Iha, FNP 05/03/23 1910

## 2023-05-03 NOTE — Telephone Encounter (Signed)
 Per ragan np, pt has arthritis in neck, recommends to take medication at prescribed and follow up. Patient called and no answer, left discreet voicemail to call uc back.

## 2023-05-14 ENCOUNTER — Ambulatory Visit (INDEPENDENT_AMBULATORY_CARE_PROVIDER_SITE_OTHER): Payer: Commercial Indemnity | Admitting: Physician Assistant

## 2023-05-14 VITALS — BP 137/81 | HR 80 | Ht 64.0 in | Wt 280.0 lb

## 2023-05-14 DIAGNOSIS — M5030 Other cervical disc degeneration, unspecified cervical region: Secondary | ICD-10-CM

## 2023-05-14 DIAGNOSIS — Z79899 Other long term (current) drug therapy: Secondary | ICD-10-CM

## 2023-05-14 DIAGNOSIS — Z13220 Encounter for screening for lipoid disorders: Secondary | ICD-10-CM

## 2023-05-14 DIAGNOSIS — E1165 Type 2 diabetes mellitus with hyperglycemia: Secondary | ICD-10-CM | POA: Diagnosis not present

## 2023-05-14 DIAGNOSIS — E1159 Type 2 diabetes mellitus with other circulatory complications: Secondary | ICD-10-CM | POA: Diagnosis not present

## 2023-05-14 DIAGNOSIS — Z1231 Encounter for screening mammogram for malignant neoplasm of breast: Secondary | ICD-10-CM

## 2023-05-14 DIAGNOSIS — Z13820 Encounter for screening for osteoporosis: Secondary | ICD-10-CM

## 2023-05-14 DIAGNOSIS — I152 Hypertension secondary to endocrine disorders: Secondary | ICD-10-CM | POA: Diagnosis not present

## 2023-05-14 DIAGNOSIS — Z7984 Long term (current) use of oral hypoglycemic drugs: Secondary | ICD-10-CM

## 2023-05-14 LAB — POCT GLYCOSYLATED HEMOGLOBIN (HGB A1C): Hemoglobin A1C: 7.9 % — AB (ref 4.0–5.6)

## 2023-05-14 MED ORDER — AMBULATORY NON FORMULARY MEDICATION: 11 refills | Status: AC

## 2023-05-14 MED ORDER — AMBULATORY NON FORMULARY MEDICATION: 0 refills | Status: AC

## 2023-05-14 MED ORDER — DAPAGLIFLOZIN PRO-METFORMIN ER 10-1000 MG PO TB24: 1.0000 | ORAL_TABLET | Freq: Every day | ORAL | 0 refills | Status: DC

## 2023-05-14 NOTE — Progress Notes (Signed)
 Established Patient Office Visit  Subjective   Patient ID: ZARAYAH LANTING, female    DOB: Sep 11, 1965  Age: 58 y.o. MRN: [id number]  CC: 6 month T2DM follow up  HPI Patient is a 58 yo obese female with T2DM, HTN, and HLD who presents for 6 month follow up appointment. Her last A1C was 7.2%.   She missed her 3 month follow up appointment. At her last visit, she did decline a statin medication and stated she would work on her diet and exercise. She states in the last month she has been working on an exercise regimen but has not been exercising overall and her diet has been poor. She continues to take Xigduo XR daily. She does not check her sugars at home. She denies any hypoglycemic events. She denies any chest pain, palpitations, dizziness, or paresthesias.   She was seen at urgent care on 2/27 for shoulder pain and they diagnosed her with cervical strain and muscle spasms of the neck. She states her had sharp shooting pain radiating down her right arm from the right shoulder. She has been stretching and arm weights for her arms and neck. She has been trying to use the treadmill more.   ,. Active Ambulatory Problems    Diagnosis Date Noted   Perimenopausal 08/30/2016   Elevated blood pressure reading in office with diagnosis of hypertension 08/30/2016   Hypertension goal BP (blood pressure) < 130/80 09/13/2016   Eustachian tube dysfunction, right 09/13/2016   At risk for obstructive sleep apnea 09/13/2016   Hypertension associated with diabetes (HCC) 09/29/2016   Class 3 severe obesity due to excess calories without serious comorbidity with body mass index (BMI) of 45.0 to 49.9 in adult (HCC) 03/08/2017   History of melanoma excision 03/08/2017   Type II diabetes mellitus with complication (HCC) 01/15/2018   Cervicalgia 01/15/2018   Cervical paraspinal muscle spasm 01/15/2018   Nonscarring hair loss 07/18/2018   Hyperlipidemia associated with type 2 diabetes mellitus (HCC) 07/18/2018    Dyslipidemia, goal LDL below 70 07/23/2020   Statin medication declined by patient 07/23/2020   Uncontrolled type 2 diabetes mellitus with hyperglycemia (HCC) 09/14/2021   Toenail fungus 02/21/2022   Fullness of neck 02/21/2022   Morbid obesity (HCC) 10/23/2022   Resolved Ambulatory Problems    Diagnosis Date Noted   Middle ear effusion, right 09/13/2016   Need for hepatitis B vaccination 10/02/2018   Past Medical History:  Diagnosis Date   Melanoma (HCC) 1992   Obesity    Vitamin D deficiency      Review of Systems  HENT:  Positive for tinnitus.   All other systems reviewed and are negative.  Objective:    BP 137/81 (BP Location: Left Arm, Patient Position: Sitting, Cuff Size: Large)   Pulse 80   Ht 5\' 4"  (1.626 m)   Wt 127 kg   SpO2 96%   BMI 48.06 kg/m  BP Readings from Last 3 Encounters:  05/14/23 137/81  05/03/23 136/77  04/13/23 (!) 147/84   Wt Readings from Last 3 Encounters:  05/14/23 127 kg  05/03/23 127 kg  10/23/22 129.7 kg   SpO2 Readings from Last 3 Encounters:  05/14/23 96%  05/03/23 96%  04/13/23 98%    Physical Exam Constitutional:      Appearance: She is obese.  HENT:     Head: Normocephalic and atraumatic.  Eyes:     Extraocular Movements: Extraocular movements intact.  Cardiovascular:     Rate and Rhythm: Normal  rate and regular rhythm.     Pulses: Normal pulses.     Heart sounds: Normal heart sounds.  Pulmonary:     Effort: Pulmonary effort is normal.     Breath sounds: Normal breath sounds.  Musculoskeletal:        General: No swelling. Normal range of motion.     Right lower leg: No edema.     Left lower leg: No edema.  Skin:    General: Skin is warm.  Neurological:     Mental Status: She is alert.  Psychiatric:        Mood and Affect: Mood normal.        Behavior: Behavior normal.    Last CBC Lab Results  Component Value Date   WBC 6.0 07/18/2018   HGB 13.5 07/18/2018   HCT 39.7 07/18/2018   MCV 83.4 07/18/2018    MCH 28.4 07/18/2018   RDW 13.3 07/18/2018   PLT 297 07/18/2018   Last metabolic panel Lab Results  Component Value Date   GLUCOSE 118 (H) 11/30/2020   NA 138 11/30/2020   K 3.5 11/30/2020   CL 96 11/30/2020   CO2 26 11/30/2020   BUN 15 11/30/2020   CREATININE 0.68 11/30/2020   EGFR 103 11/30/2020   CALCIUM 9.7 11/30/2020   PROT 7.1 11/30/2020   ALBUMIN 4.9 11/30/2020   LABGLOB 2.2 11/30/2020   AGRATIO 2.2 11/30/2020   BILITOT 0.4 11/30/2020   ALKPHOS 108 11/30/2020   AST 24 11/30/2020   ALT 27 11/30/2020   Last lipids Lab Results  Component Value Date   CHOL 249 (H) 11/30/2020   HDL 64 11/30/2020   LDLCALC 161 (H) 11/30/2020   TRIG 135 11/30/2020   CHOLHDL 3.7 09/19/2017   Last hemoglobin A1c Lab Results  Component Value Date   HGBA1C 7.9 (A) 05/14/2023   Last thyroid functions Lab Results  Component Value Date   TSH 4.090 11/30/2020    The 10-year ASCVD risk score (Arnett DK, et al., 2019) is: 7.7%    Assessment & Plan:  Marland KitchenMarland KitchenJayleene was seen today for medical management of chronic issues.  Diagnoses and all orders for this visit:  Uncontrolled type 2 diabetes mellitus with hyperglycemia (HCC) -     CMP14+EGFR -     Lipid panel -     TSH -     Dapagliflozin Pro-metFORMIN ER (XIGDUO XR) 12-998 MG TB24; Take 1 tablet by mouth daily. -     POCT HgB A1C  DDD (degenerative disc disease), cervical  Screening for lipid disorders  Medication management -     CMP14+EGFR -     Lipid panel -     TSH  Visit for screening mammogram -     MM 3D SCREENING MAMMOGRAM BILATERAL BREAST; Future  Osteoporosis screening -     DG Bone Density; Future  Other orders -     AMBULATORY NON FORMULARY MEDICATION; Cervical neck pillow with arm tunnel for cervical DDD. -     AMBULATORY NON FORMULARY MEDICATION; Massage therapy twice a month for cervical DDD. -     AMBULATORY NON FORMULARY MEDICATION; Tens unit to use 2-3 times a day for cervical DDD.    - Denies COVID,  PNA, and shingles vaccine  - Eye exam UTD - DM urine UTD - Mammogram UTD - BP not to goal: 137/97mmHg  - GOAL: <130/66mmHg - A1C not to goal: 7.9% today  - GOAL: <7% - emphasized importance of 150 mins of exercise /week  and prioritizing protein in each meal  - Suggested massage therapy, exercises, NSAIDs, heating the shoulder for pain management - Prescription for cervical neck pillow with arm tunneling - Follow up with HENT for tinnitus - DEXA scan ordered today - Continue vitamin D and calcium  - F/u in 3 months   Ilean China, [place]

## 2023-05-14 NOTE — Patient Instructions (Signed)
 Cervical Strain and Sprain Rehab Ask your health care provider which exercises are safe for you. Do exercises exactly as told by your health care provider and adjust them as directed. It is normal to feel mild stretching, pulling, tightness, or discomfort as you do these exercises. Stop right away if you feel sudden pain or your pain gets worse. Do not begin these exercises until told by your health care provider. Stretching and range-of-motion exercises Cervical side bending  Using good posture, sit on a stable chair or stand up. Without moving your shoulders, slowly tilt your left / right ear to your shoulder until you feel a stretch in the neck muscles on the opposite side. You should be looking straight ahead. Hold for __________ seconds. Repeat with the other side of your neck. Repeat __________ times. Complete this exercise __________ times a day. Cervical rotation  Using good posture, sit on a stable chair or stand up. Slowly turn your head to the side as if you are looking over your left / right shoulder. Keep your eyes level with the ground. Stop when you feel a stretch along the side and the back of your neck. Hold for __________ seconds. Repeat this by turning to your other side. Repeat __________ times. Complete this exercise __________ times a day. Thoracic extension and pectoral stretch  Roll a towel or a small blanket so it is about 4 inches (10 cm) in diameter. Lie down on your back on a firm surface. Put the towel in the middle of your back across your spine. It should not be under your shoulder blades. Put your hands behind your head and let your elbows fall out to your sides. Hold for __________ seconds. Repeat __________ times. Complete this exercise __________ times a day. Strengthening exercises Upper cervical flexion  Lie on your back with a thin pillow behind your head or a small, rolled-up towel under your neck. Gently tuck your chin toward your chest and  nod your head down to look toward your feet. Do not lift your head off the pillow. Hold for __________ seconds. Release the tension slowly. Relax your neck muscles completely before you repeat this exercise. Repeat __________ times. Complete this exercise __________ times a day. Cervical extension  Stand about 6 inches (15 cm) away from a wall, with your back facing the wall. Place a soft object, about 6-8 inches (15-20 cm) in diameter, between the back of your head and the wall. A soft object could be a small pillow, a ball, or a folded towel. Gently tilt your head back and press into the soft object. Keep your jaw and forehead relaxed. Hold for __________ seconds. Release the tension slowly. Relax your neck muscles completely before you repeat this exercise. Repeat __________ times. Complete this exercise __________ times a day. Posture and body mechanics Body mechanics refer to the movements and positions of your body while you do your daily activities. Posture is part of body mechanics. Good posture and healthy body mechanics can help to relieve stress in your body's tissues and joints. Good posture means that your spine is in its natural S-curve position (your spine is neutral), your shoulders are pulled back slightly, and your head is not tipped forward. The following are general guidelines for using improved posture and body mechanics in your everyday activities. Sitting  When sitting, keep your spine neutral and keep your feet flat on the floor. Use a footrest, if needed, and keep your thighs parallel to the floor. Avoid  rounding your shoulders. Avoid tilting your head forward. When working at a desk or a computer, keep your desk at a height where your hands are slightly lower than your elbows. Slide your chair under your desk so you are close enough to maintain good posture. When working at a computer, place your monitor at a height where you are looking straight ahead and you do not have  to tilt your head forward or downward to look at the screen. Standing  When standing, keep your spine neutral and keep your feet about hip-width apart. Keep a slight bend in your knees. Your ears, shoulders, and hips should line up. When you do a task in which you stand in one place for a long time, place one foot up on a stable object that is 2-4 inches (5-10 cm) high, such as a footstool. This helps keep your spine neutral. Resting When lying down and resting, avoid positions that are most painful for you. Try to support your neck in a neutral position. You can use a contour pillow or a small rolled-up towel. Your pillow should support your neck but not push on it. This information is not intended to replace advice given to you by your health care provider. Make sure you discuss any questions you have with your health care provider. Document Revised: 06/26/2022 Document Reviewed: 09/12/2021 Elsevier Patient Education  2024 [employer]Degenerative Disk Disease  Degenerative disk disease is a condition caused by changes that occur in the spinal disks as a person ages. Spinal disks are soft and compressible disks located between the bones of your spine (vertebrae). These disks act like shock absorbers. Degenerative disk disease can affect the whole spine. However, the neck and lower back are most often affected. Many changes can occur in the spinal disks with aging, such as: The spinal disks may dry and shrink. Small tears may occur in the tough, outer covering of the disk (annulus). The disk space may become smaller due to loss of water. Abnormal growths in the bone (spurs) may occur. This can put pressure on the nerve roots exiting the spinal canal, causing pain. The spinal canal may become narrowed. What are the causes? This condition may be caused by: Normal degeneration with age. Injuries. Certain activities and sports that cause damage. What increases the risk? The following factors  may make you more likely to develop this condition: Being overweight. Having a family history of degenerative disk disease. Smoking and use of products that contain nicotine and tobacco. Sudden injury. Doing work that requires heavy lifting. What are the signs or symptoms? Symptoms of this condition include: Pain that varies in intensity. Some people have no pain, while others have severe pain. The location of the pain depends on the part of your backbone that is affected. You may have: Pain in your neck or arm if a disk in your neck area is affected. Pain in your back, buttocks, or legs if a disk in your lower back is affected. Pain that becomes worse while bending or reaching up, or with twisting movements. Pain that may start gradually and worsen as time passes. It may also start after a major or minor injury. Numbness or tingling in the arms or legs. How is this diagnosed? This condition may be diagnosed based on: Your symptoms and medical history. A physical exam. Imaging tests, including: X-ray of the spine. CT scan. MRI. How is this treated? This condition may be treated with: Medicines. Injection of steroids into the back.  Rehabilitation exercises. These activities aim to strengthen muscles in your back and abdomen to better support your spine. If treatments do not help to relieve your symptoms or you have severe pain, you may need surgery. Follow these instructions at home: Medicines Take over-the-counter and prescription medicines only as told by your health care provider. Ask your health care provider if the medicine prescribed to you: Requires you to avoid driving or using machinery. Can cause constipation. You may need to take these actions to prevent or treat constipation: Drink enough fluid to keep your urine pale yellow. Take over-the-counter or prescription medicines. Eat foods that are high in fiber, such as beans, whole grains, and fresh fruits and  vegetables. Limit foods that are high in fat and processed sugars, such as fried or sweet foods. Activity Rest as told by your health care provider. Avoid sitting for a long time without moving. Get up to take short walks every 1-2 hours. This is important to improve blood flow and breathing. Ask for help if you feel weak or unsteady. Return to your normal activities as told by your health care provider. Ask your health care provider what activities are safe for you. Perform relaxation exercises as told by your health care provider. Maintain good posture. Do not lift anything that is heavier than 10 lb (4.5 kg), or the limit that you are told, until your health care provider says that it is safe. Follow proper lifting and walking techniques as told by your health care provider. Managing pain, stiffness, and swelling     If directed, put ice on the painful area. Icing can help to relieve pain. To do this: Put ice in a plastic bag. Place a towel between your skin and the bag. Leave the ice on for 20 minutes, 2-3 times a day. Remove the ice if your skin turns bright red. This is very important. If you cannot feel pain, heat, or cold, you have a greater risk of damage to the area. If directed, apply heat to the painful area as often as told by your health care provider. Heat can reduce the stiffness of your muscles. Use the heat source that your health care provider recommends, such as a moist heat pack or a heating pad. Place a towel between your skin and the heat source. Leave the heat on for 20-30 minutes. Remove the heat if your skin turns bright red. This is especially important if you are unable to feel pain, heat, or cold. You may have a greater risk of getting burned. General instructions Change your sitting, standing, and sleeping habits as told by your health care provider. Avoid sitting in the same position for long periods of time. Change positions frequently. Lose weight or  maintain a healthy weight as told by your health care provider. Do not use any products that contain nicotine or tobacco, such as cigarettes, e-cigarettes, and chewing tobacco. If you need help quitting, ask your health care provider. Wear supportive footwear. Keep all follow-up visits. This is important. This may include visits for physical therapy. Contact a health care provider if you: Have pain that does not go away within 1-4 weeks. Lose your appetite. Lose weight without trying. Get help right away if you: Have severe pain. Notice weakness in your arms, hands, or legs. Begin to lose control of your bladder or bowel movements. Have fevers or night sweats. Summary Degenerative disk disease is a condition caused by changes that occur in the spinal disks as a person  ages. This condition can affect the whole spine. However, the neck and lower back are most often affected. Take over-the-counter and prescription medicines only as told by your health care provider. This information is not intended to replace advice given to you by your health care provider. Make sure you discuss any questions you have with your health care provider. Document Revised: 06/05/2019 Document Reviewed: 06/05/2019 Elsevier Patient Education  2024 [employer].

## 2023-05-15 ENCOUNTER — Encounter: Payer: Self-pay | Admitting: Physician Assistant

## 2023-05-15 DIAGNOSIS — M5030 Other cervical disc degeneration, unspecified cervical region: Secondary | ICD-10-CM | POA: Insufficient documentation

## 2023-05-15 MED ORDER — CANDESARTAN CILEXETIL-HCTZ 32-25 MG PO TABS: 1.0000 | ORAL_TABLET | Freq: Every day | ORAL | 1 refills | Status: DC

## 2023-05-28 ENCOUNTER — Telehealth: Payer: Self-pay

## 2023-05-28 NOTE — Telephone Encounter (Signed)
 Copied from CRM [id number]. Topic: Clinical - Medication Question >> May 25, 2023  2:15 PM Teressa P wrote: Reason for CRM: pt has a problem with the Davonna Belling  She would like Dr. Caleen Essex to call her back because the price of the drug is to high.    [phone]

## 2023-05-28 NOTE — Telephone Encounter (Signed)
 I gave patient the information for a savings card. She will take it to the pharmacy and call us back if needed.

## 2023-08-13 ENCOUNTER — Ambulatory Visit: Admitting: Physician Assistant

## 2023-08-30 LAB — HM DIABETES EYE EXAM

## 2023-09-10 ENCOUNTER — Encounter: Payer: Self-pay | Admitting: Physician Assistant

## 2023-09-10 ENCOUNTER — Ambulatory Visit (INDEPENDENT_AMBULATORY_CARE_PROVIDER_SITE_OTHER): Admitting: Physician Assistant

## 2023-09-10 VITALS — BP 140/78 | HR 77 | Ht 64.0 in | Wt 279.0 lb

## 2023-09-10 DIAGNOSIS — E1159 Type 2 diabetes mellitus with other circulatory complications: Secondary | ICD-10-CM

## 2023-09-10 DIAGNOSIS — M5030 Other cervical disc degeneration, unspecified cervical region: Secondary | ICD-10-CM

## 2023-09-10 DIAGNOSIS — Z5320 Procedure and treatment not carried out because of patient's decision for unspecified reasons: Secondary | ICD-10-CM

## 2023-09-10 DIAGNOSIS — E785 Hyperlipidemia, unspecified: Secondary | ICD-10-CM | POA: Diagnosis not present

## 2023-09-10 DIAGNOSIS — E1165 Type 2 diabetes mellitus with hyperglycemia: Secondary | ICD-10-CM

## 2023-09-10 DIAGNOSIS — I152 Hypertension secondary to endocrine disorders: Secondary | ICD-10-CM

## 2023-09-10 DIAGNOSIS — Z7984 Long term (current) use of oral hypoglycemic drugs: Secondary | ICD-10-CM

## 2023-09-10 LAB — POCT GLYCOSYLATED HEMOGLOBIN (HGB A1C): Hemoglobin A1C: 7.3 % — AB (ref 4.0–5.6)

## 2023-09-10 MED ORDER — DAPAGLIFLOZIN PRO-METFORMIN ER 10-1000 MG PO TB24
1.0000 | ORAL_TABLET | Freq: Every day | ORAL | 0 refills | Status: DC
Start: 2023-09-10 — End: 2024-01-16

## 2023-09-10 NOTE — Progress Notes (Signed)
 Established Patient Office Visit  Subjective   Patient ID: Martha Adkins, female    DOB: 19-Nov-1965  Age: 58 y.o. MRN: [id number]  Chief Complaint  Patient presents with   Medical Management of Chronic Issues    Last A1c 7.9    HPI Pt is a 58 yo obese female with T2DM, HTN, HLD  Pt is not checking sugars at home but has monthly A1C kit and last check was 8.4. she is unsure if this is accurate. She is not checking BP at home. She denies any hypoglycemic symptoms. She is compliant with medication she is currently on. She is not doing great with food choices over last month. She is trying to move more and walk daily. She denies any CP, palpitations, headaches or vision changes.    ROS See HPI.    Objective:     BP (!) 140/78 (BP Location: Right Arm, Patient Position: Sitting, Cuff Size: Large)   Pulse 77   Ht 5' 4 (1.626 m)   Wt 279 lb (126.6 kg)   SpO2 99%   BMI 47.89 kg/m  BP Readings from Last 3 Encounters:  09/10/23 (!) 140/78  05/14/23 137/81  05/03/23 136/77   Wt Readings from Last 3 Encounters:  09/10/23 279 lb (126.6 kg)  05/14/23 280 lb (127 kg)  05/03/23 280 lb (127 kg)   .SABRA Results for orders placed or performed in visit on 09/10/23  POCT glycosylated hemoglobin (Hb A1C)   Collection Time: 09/10/23 11:13 AM  Result Value Ref Range   Hemoglobin A1C 7.3 (A) 4.0 - 5.6 %   HbA1c POC (<> result, manual entry)     HbA1c, POC (prediabetic range)     HbA1c, POC (controlled diabetic range)        Physical Exam Constitutional:      Appearance: Normal appearance. She is obese.  HENT:     Head: Normocephalic.  Cardiovascular:     Rate and Rhythm: Normal rate and regular rhythm.  Pulmonary:     Effort: Pulmonary effort is normal.     Breath sounds: Normal breath sounds.  Musculoskeletal:     Right lower leg: No edema.     Left lower leg: No edema.  Neurological:     General: No focal deficit present.     Mental Status: She is alert and oriented to  person, place, and time.  Psychiatric:        Mood and Affect: Mood normal.       The 10-year ASCVD risk score (Arnett DK, et al., 2019) is: 8%    Assessment & Plan:  SABRASABRASheba was seen today for medical management of chronic issues.  Diagnoses and all orders for this visit:  Uncontrolled type 2 diabetes mellitus with hyperglycemia (HCC) -     CMP14+EGFR -     POCT glycosylated hemoglobin (Hb A1C) -     Dapagliflozin  Pro-metFORMIN  ER (XIGDUO  XR) 12-998 MG TB24; Take 1 tablet by mouth daily. -     TSH + free T4 -     Lipid panel  Hypertension associated with diabetes (HCC) -     CMP14+EGFR  Dyslipidemia, goal LDL below 70 -     CMP14+EGFR -     Lipid panel  Morbid obesity (HCC) -     TSH + free T4  Statin medication declined by patient   A1C has improved but not quite at goal that is under 7.0 Pt declines adding any medication Refilled xigduo  BP improved  with 2nd recheck but not to goal Start checking at home with goal under 130/80 Pt declines starting any new medication for BP, pt is taking ARB Fasting labs ordered today to check lipid and kidney function Pt declined statin Eye exam dome last week and will get report   Return in about 3 months (around 12/11/2023).    Shakeel Disney, PA-C

## 2023-09-10 NOTE — Patient Instructions (Signed)
 Keep up good work with sugars they are trending down.

## 2023-09-11 ENCOUNTER — Ambulatory Visit: Payer: Self-pay | Admitting: Physician Assistant

## 2023-09-11 LAB — CMP14+EGFR
ALT: 61 IU/L — ABNORMAL HIGH (ref 0–32)
AST: 49 IU/L — ABNORMAL HIGH (ref 0–40)
Albumin: 4.3 g/dL (ref 3.8–4.9)
Alkaline Phosphatase: 107 IU/L (ref 44–121)
BUN/Creatinine Ratio: 17 (ref 9–23)
BUN: 11 mg/dL (ref 6–24)
Bilirubin Total: 0.3 mg/dL (ref 0.0–1.2)
CO2: 23 mmol/L (ref 20–29)
Calcium: 10 mg/dL (ref 8.7–10.2)
Chloride: 100 mmol/L (ref 96–106)
Creatinine, Ser: 0.63 mg/dL (ref 0.57–1.00)
Globulin, Total: 2.4 g/dL (ref 1.5–4.5)
Glucose: 183 mg/dL — ABNORMAL HIGH (ref 70–99)
Potassium: 3.9 mmol/L (ref 3.5–5.2)
Sodium: 141 mmol/L (ref 134–144)
Total Protein: 6.7 g/dL (ref 6.0–8.5)
eGFR: 103 mL/min/1.73 (ref >59)

## 2023-09-11 LAB — TSH+FREE T4
Free T4: 1.19 ng/dL (ref 0.82–1.77)
TSH: 4.4 u[IU]/mL (ref 0.450–4.500)

## 2023-09-11 LAB — LIPID PANEL
Chol/HDL Ratio: 3.6 ratio (ref 0.0–4.4)
Cholesterol, Total: 238 mg/dL — ABNORMAL HIGH (ref 100–199)
HDL: 67 mg/dL (ref >39)
LDL Chol Calc (NIH): 154 mg/dL — ABNORMAL HIGH (ref 0–99)
Triglycerides: 95 mg/dL (ref 0–149)
VLDL Cholesterol Cal: 17 mg/dL (ref 5–40)

## 2023-09-11 NOTE — Progress Notes (Signed)
 Martha Adkins,   Liver enzymes are up some. Are you drinking alcohol or taking more tylenol. Avoid both and recheck in 2-3 weeks.   LDL not to goal of under 70. This is likely going to take medication to get you to goal. Are you willing to try anything for cholesterol?   Martha Adkins.The 10-year ASCVD risk score (Arnett DK, et al., 2019) is: 7.4%   Values used to calculate the score:     Age: 58 years     Clincally relevant sex: Female     Is Non-Hispanic African American: No     Diabetic: Yes     Tobacco smoker: No     Systolic Blood Pressure: 140 mmHg     Is BP treated: Yes     HDL Cholesterol: 67 mg/dL     Total Cholesterol: 238 mg/dL

## 2024-01-14 ENCOUNTER — Other Ambulatory Visit: Payer: Self-pay | Admitting: Physician Assistant

## 2024-01-14 DIAGNOSIS — E1165 Type 2 diabetes mellitus with hyperglycemia: Secondary | ICD-10-CM

## 2024-02-15 ENCOUNTER — Ambulatory Visit: Admitting: Physician Assistant

## 2024-03-17 ENCOUNTER — Ambulatory Visit: Admitting: Physician Assistant

## 2024-03-24 ENCOUNTER — Ambulatory Visit: Admitting: Physician Assistant

## 2024-03-24 ENCOUNTER — Telehealth: Payer: Self-pay

## 2024-03-24 VITALS — BP 140/98 | HR 70 | Ht 64.0 in | Wt 275.5 lb

## 2024-03-24 DIAGNOSIS — R809 Proteinuria, unspecified: Secondary | ICD-10-CM | POA: Diagnosis not present

## 2024-03-24 DIAGNOSIS — I152 Hypertension secondary to endocrine disorders: Secondary | ICD-10-CM | POA: Diagnosis not present

## 2024-03-24 DIAGNOSIS — R748 Abnormal levels of other serum enzymes: Secondary | ICD-10-CM | POA: Insufficient documentation

## 2024-03-24 DIAGNOSIS — E785 Hyperlipidemia, unspecified: Secondary | ICD-10-CM | POA: Diagnosis not present

## 2024-03-24 DIAGNOSIS — E1165 Type 2 diabetes mellitus with hyperglycemia: Secondary | ICD-10-CM | POA: Diagnosis not present

## 2024-03-24 DIAGNOSIS — Z7984 Long term (current) use of oral hypoglycemic drugs: Secondary | ICD-10-CM

## 2024-03-24 DIAGNOSIS — B351 Tinea unguium: Secondary | ICD-10-CM | POA: Diagnosis not present

## 2024-03-24 DIAGNOSIS — E1159 Type 2 diabetes mellitus with other circulatory complications: Secondary | ICD-10-CM

## 2024-03-24 LAB — POCT GLYCOSYLATED HEMOGLOBIN (HGB A1C): Hemoglobin A1C: 8 % — AB (ref 4.0–5.6)

## 2024-03-24 LAB — POCT UA - MICROALBUMIN
Creatinine, POC: 50 mg/dL
Microalbumin Ur, POC: 30 mg/L

## 2024-03-24 MED ORDER — CANDESARTAN CILEXETIL-HCTZ 32-25 MG PO TABS: 1.0000 | ORAL_TABLET | Freq: Every day | ORAL | 1 refills | Status: AC

## 2024-03-24 MED ORDER — CICLOPIROX 8 % EX SOLN: Freq: Every day | CUTANEOUS | 1 refills | Status: AC

## 2024-03-24 MED ORDER — DAPAGLIFLOZIN PRO-METFORMIN ER 10-1000 MG PO TB24: 1.0000 | ORAL_TABLET | Freq: Every day | ORAL | 0 refills | Status: AC

## 2024-03-24 NOTE — Telephone Encounter (Signed)
 Medication sent to [facility]

## 2024-03-24 NOTE — Telephone Encounter (Signed)
 Copied from CRM [id number]. Topic: Clinical - Prescription Issue >> Mar 24, 2024 11:24 AM Emylou G wrote: Reason for CRM: She was seen today.. there was confusion on her refill at the appt.. She actually needs: ciclopirox  (PENLAC ) 8 % solution - not the Candesartan  Cilexetil-HCTZ 32-25 MG TABS.SABRA Can we please send this to [facility]?

## 2024-03-24 NOTE — Progress Notes (Unsigned)
 "  Established Patient Office Visit  Subjective   Patient ID: Martha Adkins, female    DOB: 01/03/1966  Age: 59 y.o. MRN: [id number]   HPI .Discussed the use of AI scribe software for clinical note transcription with the patient, who gave verbal consent to proceed.  History of Present Illness Martha Adkins is a 59 year old female with diabetes, hypertension, hyperlipidemia who presents for follow-up.  Glycemic control - Hemoglobin A1c increased from 7.3 to 8.0 since last visit - Dietary indiscretions during the holidays contributed to elevated A1c despite increased physical activity, including walking 24,000 steps at [facility] - Currently attempting dietary improvements by reducing soda intake and increasing water consumption - Taking Xigduo  for diabetes, but occasionally forgets doses despite setting reminders - No significant sleep disruption from medication, though sometimes wakes at night to urinate  Hypertension - Blood pressure measured at 154/87 today - Prescribed Candesartan  HCTZ, but takes only half a pill due to previous side effects including dizziness and visual disturbances - Home blood pressure monitoring reveals occasional diastolic readings in the 120s-130s over 70s and 80s  Renal protection - History of microalbuminuria - On angiotensin receptor blocker (ARB) for kidney protection  Onychomycosis - Previous fungal infection of toenails treated successfully last spring with ciclopirox  - Uncertain if current toenail discoloration is due to polish or recurrent fungus - Frequent use of toenail polish, especially during holidays  General symptoms and recent injury - No chest pain or shortness of breath - Recent minor injury from electric tool, managed with butterfly bandages and liquid bandage - Wound is healing well    ROS See HPI.    Objective:     BP (!) 154/87   Pulse 70   Ht 5' 4 (1.626 m)   Wt 275 lb 8 oz (125 kg)   SpO2 95%   BMI 47.29 kg/m  BP  Readings from Last 3 Encounters:  03/24/24 (!) 154/87  09/10/23 (!) 140/78  05/14/23 137/81   Wt Readings from Last 3 Encounters:  03/24/24 275 lb 8 oz (125 kg)  09/10/23 279 lb (126.6 kg)  05/14/23 280 lb (127 kg)    .SABRA Results for orders placed or performed in visit on 03/24/24  POCT HgB A1C   Collection Time: 03/24/24 10:16 AM  Result Value Ref Range   Hemoglobin A1C 8.0 (A) 4.0 - 5.6 %   HbA1c POC (<> result, manual entry)     HbA1c, POC (prediabetic range)     HbA1c, POC (controlled diabetic range)    POCT UA - Microalbumin   Collection Time: 03/24/24 10:16 AM  Result Value Ref Range   Microalbumin Ur, POC 30 mg/L   Creatinine, POC 50 mg/dL   Albumin/Creatinine Ratio, Urine, POC 30-300      Physical Exam Constitutional:      Appearance: Normal appearance. She is obese.  HENT:     Head: Normocephalic.  Cardiovascular:     Rate and Rhythm: Normal rate and regular rhythm.  Pulmonary:     Effort: Pulmonary effort is normal.     Breath sounds: Normal breath sounds.  Musculoskeletal:     Right lower leg: No edema.     Left lower leg: No edema.  Neurological:     General: No focal deficit present.     Mental Status: She is alert and oriented to person, place, and time.  Psychiatric:        Mood and Affect: Mood normal.  The 10-year ASCVD risk score (Arnett DK, et al., 2019) is: 9.8%    Assessment & Plan:  .Diagnoses and all orders for this visit:  Uncontrolled type 2 diabetes mellitus with hyperglycemia (HCC) -     POCT HgB A1C -     POCT UA - Microalbumin  Hypertension associated with diabetes (HCC)  Dyslipidemia, goal LDL below 70  Morbid obesity (HCC)   Assessment & Plan Uncontrolled Type 2 diabetes mellitus with hyperglycemia and microalbuminuria A1c increased to 8.0, indicating poor glycemic control. Microalbuminuria suggests early kidney involvement. Current treatment with Xigduo  and ARB.SABRA Discussed GLP-1 receptor agonists for glycemic  control and food impulse reduction. Emphasized A1c target under 7 to prevent organ damage. - Rechecked blood pressure before leaving the clinic, with improvement but still not to goal. - Ordered CMP to assess kidney function. Discussed with patient microalbumin is positive.  On ARB and SGLT-2.  - Provided handout on Wegovy and Rybelsus. - Encouraged exploration of Overeaters Anonymous meetings. - Discussed potential initiation of GLP-1 receptor agonist therapy.  Hypertension associated with diabetes Blood pressure elevated at 154/87. Recheck improved but not to goal.  Candesartan  HCTZ causes dizziness and nausea at full dose. Home monitoring shows variable readings. Emphasized blood pressure control to prevent complications. - Continue Candesartan  HCTZ but encouraged to increase to full tablet dose for better BP control.  - Continue to check BP at home with goal of under 130/80.   Morbid obesity Contributes to difficulty managing diabetes and hypertension. Discussed GLP-1 receptor agonists for weight management and food impulse reduction. - Encouraged exploration of Overeaters Anonymous meetings. - Discussed potential initiation of GLP-1 receptor agonist therapy.  General health maintenance Foot exam showed no concerns.   Follow up in 3 months.   Joal Eakle, PA-C  "

## 2024-03-24 NOTE — Patient Instructions (Signed)
 Semaglutide  Tablets What is this medication? SEMAGLUTIDE  (SEM a GLOO tide) treats type 2 diabetes. It works by increasing insulin levels in your body, which decreases your blood sugar (glucose). It also reduces the amount of sugar released into the blood and slows down your digestion. Changes to diet and exercise are often combined with this medication. This medicine may be used for other purposes; ask your health care provider or pharmacist if you have questions. COMMON BRAND NAME(S): Rybelsus What should I tell my care team before I take this medication? They need to know if you have any of these conditions: Endocrine tumors (MEN 2) or if someone in your family had these tumors Eye disease History of pancreatitis Kidney disease Stomach or intestine problems Thyroid  cancer or if someone in your family had thyroid  cancer Vision problems An unusual or allergic reaction to semaglutide , other medications, foods, dyes, or preservatives Pregnant or trying to get pregnant Breast-feeding How should I use this medication? Take this medication by mouth with a sip of water (no more than 4 ounces). Take it in the morning right after waking up every day. Take it on an empty stomach, at least 30 minutes before food, drink, or other medications. Do not cut, crush, or chew this medication. Swallow the tablets whole. Keep taking it unless your care team tells you to stop. A special MedGuide will be given to you by the pharmacist with each prescription and refill. Be sure to read this information carefully each time. Talk to your care team about the use of this medication in children. Special care may be needed. Overdosage: If you think you have taken too much of this medicine contact a poison control center or emergency room at once. NOTE: This medicine is only for you. Do not share this medicine with others. What if I miss a dose? If you miss a dose, skip it. Take your next dose at the normal time. Do not  take extra or 2 doses at the same time to make up for the missed dose. What may interact with this medication? Digoxin Levothyroxine Warfarin Some medications may affect your blood sugar levels or hide the symptoms of low blood sugar (hypoglycemia). Talk with your care team about all the medications you take. They may suggest changes to your insulin dose or checking your blood sugar levels more often. Medications that may affect your blood sugar levels include: Alcohol Certain antibiotics, such as ciprofloxacin , levofloxacin, sulfamethoxazole ; trimethoprim  Certain medications for blood pressure or heart disease, such as benazepril, enalapril, lisinopril, losartan, valsartan Certain medications for mental health conditions, such as fluoxetine or olanzapine Diuretics, such as hydrochlorothiazide (HCTZ) Estrogen and progestin hormones Other medications for diabetes Steroid medications, such as prednisone  or cortisone Testosterone Thyroid  hormones Medications that may mask symptoms of low blood sugar include: Beta blockers, such as atenolol, metoprolol , propranolol Clonidine Guanethidine Reserpine This list may not describe all possible interactions. Give your health care provider a list of all the medicines, herbs, non-prescription drugs, or dietary supplements you use. Also tell them if you smoke, drink alcohol, or use illegal drugs. Some items may interact with your medicine. What should I watch for while using this medication? Visit your care team for regular checks on your progress. Tell your care team if your symptoms do not start to get better or if they get worse. You may need blood work done while you are taking this medication. Your care team will monitor your HbA1C (A1C). This test shows what your average blood  sugar (glucose) level was over the past 2 to 3 months. Know the symptoms of low blood sugar and know how to treat it. Always carry a source of quick sugar with you. Examples  include hard sugar candy or glucose tablets. Make sure others know that you can choke if you eat or drink if your blood sugar is too low and you are unable to care for yourself. Get medical help at once. Tell your care team if you have high blood sugar. Your medication dose may change if your body is under stress. Some types of stress that may affect your blood sugar include fever, infection, and surgery. Wear a medical ID bracelet or chain. Carry a card that describes your condition. List the medications and doses you take on the card. Talk to your care team about your risk of cancer. You may be more at risk for certain types of cancer if you take this medication. Talk to your care team right away if you have a lump or swelling in your neck, hoarseness that does not go away, trouble swallowing, shortness of breath, or trouble breathing. Make sure you stay hydrated while taking this medication. Drink water often. Eat fruits and veggies that have a high water content. Drink more water when it is hot or you are active. Talk to your care team right away if you have fever, infection, vomiting, diarrhea, or if you sweat a lot while taking this medication. The loss of too much body fluid may make it dangerous for you to take this medication. If you are going to need surgery or a procedure, tell your care team that you are taking this medication. Do not take this medication without first talking to your care team if you may be or could become pregnant. Your care team can help you find the option that works for you. Weight loss is not recommended during pregnancy. Maintaining healthy blood sugar levels can help reduce the risk of pregnancy complications. Talk to your care team if you are breastfeeding. When recommended, this medication may be taken. Its use during breastfeeding has not been well studied. Lactation may help lower your blood sugar levels. Your care team may recommend changes to your treatment  plan. What side effects may I notice from receiving this medication? Side effects that you should report to your care team as soon as possible: Allergic reactions--skin rash, itching, hives, swelling of the face, lips, tongue, or throat Change in vision Dehydration--increased thirst, dry mouth, feeling faint or lightheaded, headache, dark yellow or brown urine Fast or irregular heartbeat Fast or irregular heartbeat Gallbladder problems--severe stomach pain, nausea, vomiting, fever Kidney injury--decrease in the amount of urine, swelling of the ankles, hands, or feet Pancreatitis--severe stomach pain that spreads to your back or gets worse after eating or when touched, fever, nausea, vomiting Thyroid  cancer--new mass or lump in the neck, pain or trouble swallowing, trouble breathing, hoarseness Constipation Constipation Diarrhea Loss of appetite Nausea Upset stomach This list may not describe all possible side effects. Call your doctor for medical advice about side effects. You may report side effects to FDA at 1-800-FDA-1088. Where should I keep my medication? Keep out of the reach of children and pets. Store at room temperature between 20 and 25 degrees C (68 and 77 degrees F). Keep this medication in the original container. Protect from moisture. Keep the container tightly closed. Get rid of any unused medication after the expiration date. To get rid of medications that are no longer  needed or have expired: Take the medication to a medication take-back program. Check with your pharmacy or law enforcement to find a location. If you cannot return the medication, check the label or package insert to see if the medication should be thrown out in the garbage or flushed down the toilet. If you are not sure, ask your care team. If it is safe to put it in the trash, take the medication out of the container. Mix the medication with cat litter, dirt, coffee grounds, or other unwanted substance. Seal  the mixture in a bag or container. Put it in the trash. NOTE: This sheet is a summary. It may not cover all possible information. If you have questions about this medicine, talk to your doctor, pharmacist, or health care provider.  2025 Elsevier/Gold Standard (2023-03-27 00:00:00)

## 2024-03-25 ENCOUNTER — Telehealth: Payer: Self-pay | Admitting: Physician Assistant

## 2024-03-25 ENCOUNTER — Ambulatory Visit: Payer: Self-pay | Admitting: Physician Assistant

## 2024-03-25 DIAGNOSIS — R748 Abnormal levels of other serum enzymes: Secondary | ICD-10-CM

## 2024-03-25 LAB — CMP14+EGFR
ALT: 59 IU/L — ABNORMAL HIGH (ref 0–32)
AST: 54 IU/L — ABNORMAL HIGH (ref 0–40)
Albumin: 4.4 g/dL (ref 3.8–4.9)
Alkaline Phosphatase: 100 IU/L (ref 49–135)
BUN/Creatinine Ratio: 20 (ref 9–23)
BUN: 13 mg/dL (ref 6–24)
Bilirubin Total: 0.5 mg/dL (ref 0.0–1.2)
CO2: 24 mmol/L (ref 20–29)
Calcium: 9.7 mg/dL (ref 8.7–10.2)
Chloride: 101 mmol/L (ref 96–106)
Creatinine, Ser: 0.65 mg/dL (ref 0.57–1.00)
Globulin, Total: 2.2 g/dL (ref 1.5–4.5)
Glucose: 182 mg/dL — ABNORMAL HIGH (ref 70–99)
Potassium: 4.2 mmol/L (ref 3.5–5.2)
Sodium: 143 mmol/L (ref 134–144)
Total Protein: 6.6 g/dL (ref 6.0–8.5)
eGFR: 102 mL/min/1.73

## 2024-03-25 NOTE — Progress Notes (Signed)
 Martha Adkins,   Liver enzymes are still elevated. I would like to get ultrasound of liver.

## 2024-03-25 NOTE — Telephone Encounter (Signed)
 Copied from CRM [id number]. Topic: General - Other >> Mar 25, 2024  3:24 PM Berwyn MATSU wrote: Reason for CRM: insurance called in to see if pre-cert was submitted for ultrasound. >> Mar 25, 2024  3:52 PM Emylou G wrote: shavan w/[employer] called.. wanted to adv if we use CPT: 6846503969  ICD 174.8 - that will not be covered unless deemed medical necessity.. Ins co calling to give us  heads up.. She did provide info to patient to bring to us  for alternate code to use to be covered.

## 2024-03-25 NOTE — Telephone Encounter (Signed)
 [facility] if possible.

## 2024-03-26 ENCOUNTER — Encounter: Payer: Self-pay | Admitting: Physician Assistant

## 2024-03-26 NOTE — Telephone Encounter (Signed)
 Are you able to see what codes would cover the abdominal ultrasound. I was wanting it for elevated liver enzymes.

## 2024-03-26 NOTE — Telephone Encounter (Signed)
 Would any of these ICD 10 codes be appropriate? R93.5: Abnormal findings on diagnostic imaging of other abdominal regions, including retroperitoneum. K76.0: Fatty (change of) liver, not elsewhere classified (for liver issues). K75.81: Nonalcoholic steatohepatitis (NASH). R10.11: Right upper quadrant pain (or other pain codes).

## 2024-03-26 NOTE — Telephone Encounter (Signed)
 I agree with trying to just see if just a simple right upper quadrant ultrasound will go through under elevated liver enzyme diagnosis.  I do not see any prior imaging that would cover the abnormal diagnostic code.  And like you said we have not fully confirmed the fatty liver even though likelihood is high with BMI greater than 40.  Orders Placed This Encounter  Procedures   US  ABDOMEN COMPLETE W/ELASTOGRAPHY    Standing Status:   Future    Expiration Date:   03/25/2025    Reason for Exam (SYMPTOM  OR DIAGNOSIS REQUIRED):   elevated liver enzymes    Preferred imaging location?:   [facility]    Release to patient:   Immediate   US  Abdomen Limited RUQ (LIVER/GB)    Standing Status:   Future    Expiration Date:   03/25/2025    Reason for Exam (SYMPTOM  OR DIAGNOSIS REQUIRED):   elevated liver enzymes    Preferred imaging location?:   [facility]

## 2024-03-26 NOTE — Telephone Encounter (Signed)
 This patients insurance is not covering ultrasound of abdomen for elevated liver enzymes but will for codes above. She is moribly obese but I can't just use fatty liver unless seen on ultrasound right?

## 2024-03-26 NOTE — Telephone Encounter (Signed)
 See other message in chart. Keep in basket.

## 2024-03-28 NOTE — Telephone Encounter (Signed)
 Last read by Hargis CHRISTELLA Fetters at 9:16AM on 03/28/2024.

## 2024-03-31 NOTE — Telephone Encounter (Signed)
 See other message

## 2024-04-28 ENCOUNTER — Other Ambulatory Visit

## 2024-06-23 ENCOUNTER — Ambulatory Visit: Admitting: Physician Assistant
